# Patient Record
Sex: Male | Born: 1970 | Race: White | Hispanic: No
Health system: Midwestern US, Community
[De-identification: ages and names within clinical notes are randomized; demographics above are authoritative.]

## PROBLEM LIST (undated history)

## (undated) DIAGNOSIS — N200 Calculus of kidney: Secondary | ICD-10-CM

## (undated) HISTORY — PX: FACIAL COSMETIC SURGERY: SHX629

---

## 2005-04-12 ENCOUNTER — Emergency Department (HOSPITAL_COMMUNITY): Admission: EM | Admit: 2005-04-12 | Discharge: 2005-04-12 | Payer: Self-pay | Admitting: Emergency Medicine

## 2008-04-04 ENCOUNTER — Emergency Department: Payer: Self-pay | Admitting: Internal Medicine

## 2009-07-23 ENCOUNTER — Emergency Department: Payer: Self-pay | Admitting: Emergency Medicine

## 2009-07-26 ENCOUNTER — Observation Stay: Payer: Self-pay | Admitting: Urology

## 2009-09-13 ENCOUNTER — Inpatient Hospital Stay: Payer: Self-pay | Admitting: Psychiatry

## 2010-06-07 ENCOUNTER — Emergency Department: Payer: Self-pay | Admitting: Emergency Medicine

## 2013-01-18 ENCOUNTER — Emergency Department: Payer: Self-pay | Admitting: Emergency Medicine

## 2013-01-18 LAB — URINALYSIS, COMPLETE
Bacteria: NONE SEEN
Bilirubin,UR: NEGATIVE
Blood: NEGATIVE
Leukocyte Esterase: NEGATIVE
Ph: 6 (ref 4.5–8.0)
Protein: NEGATIVE
RBC,UR: <1 /HPF (ref 0–5)
Specific Gravity: 1.021 (ref 1.003–1.030)
Squamous Epithelial: NONE SEEN
WBC UR: NONE SEEN /HPF (ref 0–5)

## 2013-01-18 LAB — CBC
HCT: 46.1 % (ref 40.0–52.0)
MCH: 30.8 pg (ref 26.0–34.0)
MCHC: 34.9 g/dL (ref 32.0–36.0)
MCV: 88 fL (ref 80–100)
Platelet: 210 10*3/uL (ref 150–440)
RBC: 5.23 10*6/uL (ref 4.40–5.90)
RDW: 12.8 % (ref 11.5–14.5)
WBC: 6.8 10*3/uL (ref 3.8–10.6)

## 2013-01-18 LAB — COMPREHENSIVE METABOLIC PANEL
Alkaline Phosphatase: 96 U/L (ref 50–136)
BUN: 15 mg/dL (ref 7–18)
Calcium, Total: 9 mg/dL (ref 8.5–10.1)
Chloride: 107 mmol/L (ref 98–107)
Co2: 24 mmol/L (ref 21–32)
Creatinine: 1.1 mg/dL (ref 0.60–1.30)
EGFR (African American): >60
Glucose: 94 mg/dL (ref 65–99)
Osmolality: 274 (ref 275–301)
Potassium: 3.9 mmol/L (ref 3.5–5.1)
SGPT (ALT): 29 U/L (ref 12–78)

## 2013-04-18 ENCOUNTER — Emergency Department: Payer: Self-pay | Admitting: Emergency Medicine

## 2013-04-18 LAB — PROTIME-INR: Prothrombin Time: 12.9 secs (ref 11.5–14.7)

## 2013-04-18 LAB — CBC
HCT: 40.8 % (ref 40.0–52.0)
MCH: 31.3 pg (ref 26.0–34.0)
MCHC: 36.3 g/dL — ABNORMAL HIGH (ref 32.0–36.0)
MCV: 86 fL (ref 80–100)
WBC: 5.8 10*3/uL (ref 3.8–10.6)

## 2013-04-18 LAB — COMPREHENSIVE METABOLIC PANEL
Anion Gap: 7 (ref 7–16)
BUN: 13 mg/dL (ref 7–18)
Calcium, Total: 9.2 mg/dL (ref 8.5–10.1)
Co2: 25 mmol/L (ref 21–32)
Creatinine: 1.05 mg/dL (ref 0.60–1.30)
EGFR (African American): >60
Osmolality: 278 (ref 275–301)
Potassium: 3.7 mmol/L (ref 3.5–5.1)
SGPT (ALT): 28 U/L (ref 12–78)

## 2013-04-18 LAB — APTT: Activated PTT: 27.5 secs (ref 23.6–35.9)

## 2013-07-31 ENCOUNTER — Emergency Department: Payer: Self-pay | Admitting: Emergency Medicine

## 2013-07-31 LAB — BASIC METABOLIC PANEL
Calcium, Total: 9.1 mg/dL (ref 8.5–10.1)
Chloride: 106 mmol/L (ref 98–107)
EGFR (Non-African Amer.): >60
Glucose: 138 mg/dL — ABNORMAL HIGH (ref 65–99)
Osmolality: 278 (ref 275–301)
Sodium: 138 mmol/L (ref 136–145)

## 2013-07-31 LAB — CBC
HCT: 42.8 % (ref 40.0–52.0)
HGB: 15.1 g/dL (ref 13.0–18.0)
MCV: 87 fL (ref 80–100)
RBC: 4.94 10*6/uL (ref 4.40–5.90)
RDW: 12.7 % (ref 11.5–14.5)

## 2013-08-01 LAB — URINALYSIS, COMPLETE
Blood: NEGATIVE
Glucose,UR: NEGATIVE mg/dL (ref 0–75)
Nitrite: NEGATIVE
RBC,UR: <1 /HPF (ref 0–5)
Specific Gravity: 1.026 (ref 1.003–1.030)
Squamous Epithelial: 1
WBC UR: 1 /HPF (ref 0–5)

## 2013-12-21 ENCOUNTER — Emergency Department: Payer: Self-pay | Admitting: Emergency Medicine

## 2014-06-11 ENCOUNTER — Emergency Department: Payer: Self-pay | Admitting: Emergency Medicine

## 2014-06-11 LAB — URINALYSIS, COMPLETE
Bilirubin,UR: NEGATIVE
Blood: NEGATIVE
GLUCOSE, UR: NEGATIVE mg/dL (ref 0–75)
KETONE: NEGATIVE
LEUKOCYTE ESTERASE: NEGATIVE
Nitrite: NEGATIVE
PH: 6 (ref 4.5–8.0)
PROTEIN: NEGATIVE
Specific Gravity: 1.02 (ref 1.003–1.030)
Squamous Epithelial: NONE SEEN
WBC UR: 1 /HPF (ref 0–5)

## 2014-06-11 LAB — CBC WITH DIFFERENTIAL/PLATELET
Basophil #: 0.1 10*3/uL (ref 0.0–0.1)
Basophil %: 0.7 %
Eosinophil #: 0.5 10*3/uL (ref 0.0–0.7)
Eosinophil %: 6.6 %
HCT: 44.6 % (ref 40.0–52.0)
HGB: 15.5 g/dL (ref 13.0–18.0)
Lymphocyte #: 2.5 10*3/uL (ref 1.0–3.6)
Lymphocyte %: 34.6 %
MCH: 31.1 pg (ref 26.0–34.0)
MCHC: 34.8 g/dL (ref 32.0–36.0)
MCV: 89 fL (ref 80–100)
Monocyte #: 0.5 x10 3/mm (ref 0.2–1.0)
Monocyte %: 7 %
Neutrophil #: 3.7 10*3/uL (ref 1.4–6.5)
Neutrophil %: 51.1 %
Platelet: 200 10*3/uL (ref 150–440)
RBC: 4.99 10*6/uL (ref 4.40–5.90)
RDW: 12.7 % (ref 11.5–14.5)
WBC: 7.2 10*3/uL (ref 3.8–10.6)

## 2014-06-11 LAB — COMPREHENSIVE METABOLIC PANEL
ALBUMIN: 3.6 g/dL (ref 3.4–5.0)
ALK PHOS: 79 U/L
ALT: 25 U/L
Anion Gap: 7 (ref 7–16)
BILIRUBIN TOTAL: 0.3 mg/dL (ref 0.2–1.0)
BUN: 13 mg/dL (ref 7–18)
CALCIUM: 9 mg/dL (ref 8.5–10.1)
CO2: 25 mmol/L (ref 21–32)
Chloride: 109 mmol/L — ABNORMAL HIGH (ref 98–107)
Creatinine: 1.08 mg/dL (ref 0.60–1.30)
EGFR (African American): >60
GLUCOSE: 105 mg/dL — AB (ref 65–99)
Osmolality: 282 (ref 275–301)
Potassium: 3.9 mmol/L (ref 3.5–5.1)
SGOT(AST): 26 U/L (ref 15–37)
SODIUM: 141 mmol/L (ref 136–145)
TOTAL PROTEIN: 6.9 g/dL (ref 6.4–8.2)

## 2016-07-22 ENCOUNTER — Encounter: Payer: Self-pay | Admitting: Emergency Medicine

## 2016-07-22 ENCOUNTER — Emergency Department
Admission: EM | Admit: 2016-07-22 | Discharge: 2016-07-22 | Disposition: A | Discharge status: Home / Self Care | Payer: Self-pay | Attending: Emergency Medicine | Admitting: Emergency Medicine

## 2016-07-22 ENCOUNTER — Emergency Department: Payer: Self-pay

## 2016-07-22 DIAGNOSIS — R1031 Right lower quadrant pain: Secondary | ICD-10-CM | POA: Insufficient documentation

## 2016-07-22 DIAGNOSIS — F17200 Nicotine dependence, unspecified, uncomplicated: Secondary | ICD-10-CM | POA: Insufficient documentation

## 2016-07-22 DIAGNOSIS — R1032 Left lower quadrant pain: Secondary | ICD-10-CM | POA: Insufficient documentation

## 2016-07-22 DIAGNOSIS — R110 Nausea: Secondary | ICD-10-CM | POA: Insufficient documentation

## 2016-07-22 DIAGNOSIS — R109 Unspecified abdominal pain: Secondary | ICD-10-CM

## 2016-07-22 HISTORY — DX: Calculus of kidney: N20.0

## 2016-07-22 LAB — COMPREHENSIVE METABOLIC PANEL
ALBUMIN: 4.5 g/dL (ref 3.5–5.0)
ALK PHOS: 60 U/L (ref 38–126)
ALT: 16 U/L — ABNORMAL LOW (ref 17–63)
ANION GAP: 7 (ref 5–15)
AST: 18 U/L (ref 15–41)
BUN: 12 mg/dL (ref 6–20)
CHLORIDE: 106 mmol/L (ref 101–111)
CO2: 29 mmol/L (ref 22–32)
CREATININE: 1 mg/dL (ref 0.61–1.24)
Calcium: 9.3 mg/dL (ref 8.9–10.3)
Glucose, Bld: 145 mg/dL — ABNORMAL HIGH (ref 65–99)
POTASSIUM: 4.3 mmol/L (ref 3.5–5.1)
Sodium: 142 mmol/L (ref 135–145)
Total Bilirubin: 0.6 mg/dL (ref 0.3–1.2)
Total Protein: 7.4 g/dL (ref 6.5–8.1)

## 2016-07-22 LAB — URINALYSIS COMPLETE WITH MICROSCOPIC (ARMC ONLY)
Bilirubin Urine: NEGATIVE
Glucose, UA: NEGATIVE mg/dL
Hgb urine dipstick: NEGATIVE
Ketones, ur: NEGATIVE mg/dL
Leukocytes, UA: NEGATIVE
Nitrite: NEGATIVE
Protein, ur: NEGATIVE mg/dL
Specific Gravity, Urine: 1.015 (ref 1.005–1.030)
pH: 5 (ref 5.0–8.0)

## 2016-07-22 LAB — CBC
HCT: 44.8 % (ref 40.0–52.0)
HEMOGLOBIN: 15.9 g/dL (ref 13.0–18.0)
MCH: 31.5 pg (ref 26.0–34.0)
MCHC: 35.5 g/dL (ref 32.0–36.0)
MCV: 88.7 fL (ref 80.0–100.0)
PLATELETS: 215 10*3/uL (ref 150–440)
RBC: 5.05 MIL/uL (ref 4.40–5.90)
RDW: 12.8 % (ref 11.5–14.5)
WBC: 5.7 10*3/uL (ref 3.8–10.6)

## 2016-07-22 LAB — LIPASE, BLOOD: LIPASE: 20 U/L (ref 11–51)

## 2016-07-22 MED ORDER — HYDROCODONE-ACETAMINOPHEN 5-325 MG PO TABS: 1.0000 | ORAL_TABLET | ORAL | 0 refills | Status: DC | PRN

## 2016-07-22 MED ORDER — ONDANSETRON HCL 4 MG PO TABS: 4.0000 mg | ORAL_TABLET | Freq: Every day | ORAL | 1 refills | Status: DC | PRN

## 2016-07-22 MED ORDER — MORPHINE SULFATE (PF) 4 MG/ML IV SOLN
INTRAVENOUS | Status: AC
  Filled 2016-07-22: qty 1

## 2016-07-22 MED ORDER — TAMSULOSIN HCL 0.4 MG PO CAPS: 0.4000 mg | ORAL_CAPSULE | Freq: Every day | ORAL | 0 refills | Status: DC

## 2016-07-22 MED ORDER — IOPAMIDOL (ISOVUE-300) INJECTION 61%
100.0000 mL | Freq: Once | INTRAVENOUS | Status: AC | PRN
  Administered 2016-07-22: 100 mL via INTRAVENOUS

## 2016-07-22 MED ORDER — MORPHINE SULFATE (PF) 4 MG/ML IV SOLN
4.0000 mg | Freq: Once | INTRAVENOUS | Status: AC
  Administered 2016-07-22: 4 mg via INTRAVENOUS

## 2016-07-22 MED ORDER — ONDANSETRON HCL 4 MG/2ML IJ SOLN
4.0000 mg | Freq: Once | INTRAMUSCULAR | Status: AC
  Administered 2016-07-22: 4 mg via INTRAVENOUS

## 2016-07-22 MED ORDER — KETOROLAC TROMETHAMINE 30 MG/ML IJ SOLN
30.0000 mg | Freq: Once | INTRAMUSCULAR | Status: AC
  Administered 2016-07-22: 30 mg via INTRAVENOUS
  Filled 2016-07-22: qty 1

## 2016-07-22 MED ORDER — NAPROXEN 500 MG PO TABS: 500.0000 mg | ORAL_TABLET | Freq: Two times a day (BID) | ORAL | 2 refills | Status: DC

## 2016-07-22 MED ORDER — ONDANSETRON HCL 4 MG/2ML IJ SOLN
INTRAMUSCULAR | Status: AC
  Filled 2016-07-22: qty 2

## 2016-07-22 MED ORDER — IOPAMIDOL (ISOVUE-300) INJECTION 61%
30.0000 mL | Freq: Once | INTRAVENOUS | Status: AC | PRN
  Administered 2016-07-22: 30 mL via ORAL

## 2016-07-22 NOTE — ED Notes (Signed)
Patient transported to CT 

## 2016-07-22 NOTE — ED Provider Notes (Signed)
[facility] Emergency Department Provider Note   ____________________________________________    I have reviewed the triage vital signs and the nursing notes.   HISTORY  Chief Complaint Flank Pain     HPI Arthur Kent is a 45 y.o. male who presents with complaints of abdominal pain. He reports right lower quadrant and left lower quadrant pain which is moderate to severe and began gradually this morning. He does report a long history of kidney stones but this feels different because of the gradual onset. He reports mild nausea but no vomiting. No fevers or chills. Normal stools.   Past Medical History:  Diagnosis Date  . Kidney stone     There are no active problems to display for this patient.   Past Surgical History:  Procedure Laterality Date  . FACIAL COSMETIC SURGERY      Prior to Admission medications   Medication Sig Start Date End Date Taking? Authorizing Provider  HYDROcodone-acetaminophen (NORCO/VICODIN) 5-325 MG tablet Take 1 tablet by mouth every 4 (four) hours as needed for moderate pain. 07/22/16   Jene Everyobert Penny Frisbie, MD  naproxen (NAPROSYN) 500 MG tablet Take 1 tablet (500 mg total) by mouth 2 (two) times daily with a meal. 07/22/16   Jene Everyobert Wallis Spizzirri, MD  ondansetron (ZOFRAN) 4 MG tablet Take 1 tablet (4 mg total) by mouth daily as needed for nausea or vomiting. 07/22/16   Jene Everyobert Kahlil Cowans, MD  tamsulosin (FLOMAX) 0.4 MG CAPS capsule Take 1 capsule (0.4 mg total) by mouth daily. 07/22/16   Jene Everyobert Roque Schill, MD     Allergies Codeine; Oxycodone; and Percocet [oxycodone-acetaminophen]  No family history on file.  Social History Social History  Substance Use Topics  . Smoking status: Current Every Day Smoker  . Smokeless tobacco: Never Used  . Alcohol use Yes    Review of Systems  Constitutional: No fever/chills  Cardiovascular: Denies chest pain. Respiratory: Denies Cough Gastrointestinal: As above  Genitourinary:  Negative for dysuria. No hematuria Musculoskeletal: Negative for back pain. Skin: Negative for rash.   10-point ROS otherwise negative.  ____________________________________________   PHYSICAL EXAM:  VITAL SIGNS: ED Triage Vitals  Enc Vitals Group     BP 07/22/16 0953 (!) 104/59     Pulse Rate 07/22/16 0953 87     Resp 07/22/16 0953 16     Temp 07/22/16 0956 98.6 F (37 C)     Temp Source 07/22/16 0953 Oral     SpO2 07/22/16 0953 94 %     Weight 07/22/16 0954 165 lb (74.8 kg)     Height 07/22/16 0954 5\' 11"  (1.803 m)     Head Circumference --      Peak Flow --      Pain Score 07/22/16 0956 10     Pain Loc --      Pain Edu? --      Excl. in GC? --     Constitutional: Alert and oriented. No acute distress. Pleasant and interactive Eyes: Conjunctivae are normal.   Nose: No congestion/rhinnorhea. Mouth/Throat: Mucous membranes are moist.    Cardiovascular: Normal rate, regular rhythm.  Good peripheral circulation. Respiratory: Normal respiratory effort.  No retractions. Lungs CTAB. Gastrointestinal: Mild to moderate tender to palpation the left lower quadrant and right lower quadrant. No distention.  No CVA tenderness. Genitourinary: deferred Musculoskeletal: No lower extremity tenderness nor edema.  Warm and well perfused Neurologic:  Normal speech and language. No gross focal neurologic deficits are appreciated.  Skin:  Skin is  warm, dry and intact. No rash noted. Psychiatric: Mood and affect are normal. Speech and behavior are normal.  ____________________________________________   LABS (all labs ordered are listed, but only abnormal results are displayed)  Labs Reviewed  COMPREHENSIVE METABOLIC PANEL - Abnormal; Notable for the following:       Result Value   Glucose, Bld 145 (*)    ALT 16 (*)    All other components within normal limits  URINALYSIS COMPLETEWITH MICROSCOPIC ([facility] ONLY) - Abnormal; Notable for the following:    Color, Urine YELLOW (*)     APPearance CLEAR (*)    Bacteria, UA RARE (*)    Squamous Epithelial / LPF 0-5 (*)    All other components within normal limits  LIPASE, BLOOD  CBC   ____________________________________________  EKG  None ____________________________________________  RADIOLOGY  CT abdomen and pelvis is normal ____________________________________________   PROCEDURES  Procedure(s) performed: No    Critical Care performed: No ____________________________________________   INITIAL IMPRESSION / ASSESSMENT AND PLAN / ED COURSE  Pertinent labs & imaging results that were available during my care of the patient were reviewed by me and considered in my medical decision making (see chart for details).  Patient presents with left lower quadrant and right lower quadrant abdominal pain. Differential includes diverticulitis, appendicitis, ureterolithiasis, nonspecific abdominal pain. Lab work thus far is unremarkable. We will give IV morphine, IV Zofran and obtain CT abdomen pelvis  Clinical Course  Value Comment By Time  Glucose: (!) 145 (Reviewed) Jene Every, MD 10/10 1156   ____________________________________________ ----------------------------------------- 3:15 PM on 07/22/2016 -----------------------------------------  Patient has had improvement in his pain although he continues to have some flank pain now on the right. We discussed admission for pain control but he declined, would prefer to go home on by mouth medications. We did discuss return precautions as well. Patient believes that he is not allergic to Vicodin so we will trial this.  FINAL CLINICAL IMPRESSION(S) / ED DIAGNOSES  Final diagnoses:  Flank pain      NEW MEDICATIONS STARTED DURING THIS VISIT:  New Prescriptions   HYDROCODONE-ACETAMINOPHEN (NORCO/VICODIN) 5-325 MG TABLET    Take 1 tablet by mouth every 4 (four) hours as needed for moderate pain.   NAPROXEN (NAPROSYN) 500 MG TABLET    Take 1 tablet (500 mg  total) by mouth 2 (two) times daily with a meal.   ONDANSETRON (ZOFRAN) 4 MG TABLET    Take 1 tablet (4 mg total) by mouth daily as needed for nausea or vomiting.   TAMSULOSIN (FLOMAX) 0.4 MG CAPS CAPSULE    Take 1 capsule (0.4 mg total) by mouth daily.     Note:  This document was prepared using Dragon voice recognition software and may include unintentional dictation errors.    Jene Every, MD 07/22/16 [phone]

## 2016-07-22 NOTE — ED Triage Notes (Signed)
Patient reports that last night he starting having LLQ pain that is not radiated to RLQ. Patient has hx of kidney stones. Denies urinary symptoms. Patient reports nausea, denies vomiting.

## 2016-07-22 NOTE — ED Notes (Signed)
Formatting of this note might be different from the original.  Patient transported to CT  Electronically signed by Ludwig Lean, RN at 07/22/2016  1:07 PM EDT

## 2016-07-22 NOTE — ED Provider Notes (Signed)
Formatting of this note is different from the original.  Images from the original note were not included.      [facility]  Emergency Department Provider Note    ____________________________________________    I have reviewed the triage vital signs and the nursing notes.    HISTORY    Chief Complaint  Flank Pain    HPI  Alan Gomez is a 45 y.o. male who presents with complaints of abdominal pain. He reports right lower quadrant and left lower quadrant pain which is moderate to severe and began gradually this morning. He does report a long history of kidney stones but this feels different because of the gradual onset. He reports mild nausea but no vomiting. No fevers or chills. Normal stools.    Past Medical History:   Diagnosis Date   ? Kidney stone      There are no active problems to display for this patient.    Past Surgical History:   Procedure Laterality Date   ? FACIAL COSMETIC SURGERY       Prior to Admission medications    Medication Sig Start Date End Date Taking? Authorizing Provider   HYDROcodone-acetaminophen (NORCO/VICODIN) 5-325 MG tablet Take 1 tablet by mouth every 4 (four) hours as needed for moderate pain. 07/22/16   Jene Every, MD   naproxen (NAPROSYN) 500 MG tablet Take 1 tablet (500 mg total) by mouth 2 (two) times daily with a meal. 07/22/16   Jene Every, MD   ondansetron (ZOFRAN) 4 MG tablet Take 1 tablet (4 mg total) by mouth daily as needed for nausea or vomiting. 07/22/16   Jene Every, MD   tamsulosin (FLOMAX) 0.4 MG CAPS capsule Take 1 capsule (0.4 mg total) by mouth daily. 07/22/16   Jene Every, MD       Allergies  Codeine; Oxycodone; and Percocet [oxycodone-acetaminophen]    No family history on file.    Social History  Social History   Substance Use Topics   ? Smoking status: Current Every Day Smoker   ? Smokeless tobacco: Never Used   ? Alcohol use Yes     Review of Systems    Constitutional: No fever/chills    Cardiovascular: Denies chest  pain.  Respiratory: Denies Cough  Gastrointestinal: As above   Genitourinary: Negative for dysuria. No hematuria  Musculoskeletal: Negative for back pain.  Skin: Negative for rash.    10-point ROS otherwise negative.    ____________________________________________    PHYSICAL EXAM:    VITAL SIGNS:  ED Triage Vitals   Enc Vitals Group      BP 07/22/16 0953 (!) 104/59      Pulse Rate 07/22/16 0953 87      Resp 07/22/16 0953 16      Temp 07/22/16 0956 98.6 F (37 C)      Temp Source 07/22/16 0953 Oral      SpO2 07/22/16 0953 94 %      Weight 07/22/16 0954 165 lb (74.8 kg)      Height 07/22/16 0954 5\' 11"  (1.803 m)      Head Circumference --       Peak Flow --       Pain Score 07/22/16 0956 10      Pain Loc --       Pain Edu? --       Excl. in GC? --      Constitutional: Alert and oriented. No acute distress. Pleasant and interactive  Eyes: Conjunctivae  are normal.     Nose: No congestion/rhinnorhea.  Mouth/Throat: Mucous membranes are moist.      Cardiovascular: Normal rate, regular rhythm.  Good peripheral circulation.  Respiratory: Normal respiratory effort.  No retractions. Lungs CTAB.  Gastrointestinal: Mild to moderate tender to palpation the left lower quadrant and right lower quadrant. No distention.  No CVA tenderness.  Genitourinary: deferred  Musculoskeletal: No lower extremity tenderness nor edema.  Warm and well perfused  Neurologic:  Normal speech and language. No gross focal neurologic deficits are appreciated.   Skin:  Skin is warm, dry and intact. No rash noted.  Psychiatric: Mood and affect are normal. Speech and behavior are normal.    ____________________________________________    LABS  (all labs ordered are listed, but only abnormal results are displayed)    Labs Reviewed   COMPREHENSIVE METABOLIC PANEL - Abnormal; Notable for the following:        Result Value    Glucose, Bld 145 (*)     ALT 16 (*)     All other components within normal limits   URINALYSIS COMPLETEWITH MICROSCOPIC ([facility] ONLY) -  Abnormal; Notable for the following:     Color, Urine YELLOW (*)     APPearance CLEAR (*)     Bacteria, UA RARE (*)     Squamous Epithelial / LPF 0-5 (*)     All other components within normal limits   LIPASE, BLOOD   CBC     ____________________________________________    EKG    None  ____________________________________________    RADIOLOGY    CT abdomen and pelvis is normal  ____________________________________________    PROCEDURES    Procedure(s) performed: No    Critical Care performed: No  ____________________________________________    INITIAL IMPRESSION / ASSESSMENT AND PLAN / ED COURSE    Pertinent labs & imaging results that were available during my care of the patient were reviewed by me and considered in my medical decision making (see chart for details).    Patient presents with left lower quadrant and right lower quadrant abdominal pain. Differential includes diverticulitis, appendicitis, ureterolithiasis, nonspecific abdominal pain. Lab work thus far is unremarkable. We will give IV morphine, IV Zofran and obtain CT abdomen pelvis    Clinical Course   Value Comment By Time   Glucose: (!) 145 (Reviewed) Jene Every, MD 10/10 1156     ____________________________________________  -----------------------------------------  3:15 PM on 07/22/2016  -----------------------------------------    Patient has had improvement in his pain although he continues to have some flank pain now on the right. We discussed admission for pain control but he declined, would prefer to go home on by mouth medications. We did discuss return precautions as well. Patient believes that he is not allergic to Vicodin so we will trial this.    FINAL CLINICAL IMPRESSION(S) / ED DIAGNOSES    Final diagnoses:   Flank pain     NEW MEDICATIONS STARTED DURING THIS VISIT:    New Prescriptions    HYDROCODONE-ACETAMINOPHEN (NORCO/VICODIN) 5-325 MG TABLET    Take 1 tablet by mouth every 4 (four) hours as needed for moderate pain.     NAPROXEN (NAPROSYN) 500 MG TABLET    Take 1 tablet (500 mg total) by mouth 2 (two) times daily with a meal.    ONDANSETRON (ZOFRAN) 4 MG TABLET    Take 1 tablet (4 mg total) by mouth daily as needed for nausea or vomiting.    TAMSULOSIN (FLOMAX) 0.4 MG CAPS CAPSULE  Take 1 capsule (0.4 mg total) by mouth daily.     Note:  This document was prepared using Dragon voice recognition software and may include unintentional dictation errors.      Jene Every, MD  07/22/16 [phone]    Electronically signed by Jene Every, MD at 07/22/2016  3:16 PM EDT

## 2016-07-22 NOTE — ED Triage Notes (Signed)
Formatting of this note might be different from the original.  Patient reports that last night he starting having LLQ pain that is not radiated to RLQ. Patient has hx of kidney stones. Denies urinary symptoms. Patient reports nausea, denies vomiting.   Electronically signed by Eliot Ford, RN at 07/22/2016  9:56 AM EDT

## 2016-07-27 ENCOUNTER — Encounter: Payer: Self-pay | Admitting: Emergency Medicine

## 2016-07-27 DIAGNOSIS — Z791 Long term (current) use of non-steroidal anti-inflammatories (NSAID): Secondary | ICD-10-CM | POA: Insufficient documentation

## 2016-07-27 DIAGNOSIS — K5900 Constipation, unspecified: Secondary | ICD-10-CM | POA: Insufficient documentation

## 2016-07-27 DIAGNOSIS — F17200 Nicotine dependence, unspecified, uncomplicated: Secondary | ICD-10-CM | POA: Insufficient documentation

## 2016-07-27 LAB — COMPREHENSIVE METABOLIC PANEL
ALT: 24 U/L (ref 17–63)
AST: 26 U/L (ref 15–41)
Albumin: 4 g/dL (ref 3.5–5.0)
Alkaline Phosphatase: 65 U/L (ref 38–126)
Anion gap: 3 — ABNORMAL LOW (ref 5–15)
BILIRUBIN TOTAL: 0.5 mg/dL (ref 0.3–1.2)
BUN: 12 mg/dL (ref 6–20)
CHLORIDE: 107 mmol/L (ref 101–111)
CO2: 30 mmol/L (ref 22–32)
Calcium: 9.4 mg/dL (ref 8.9–10.3)
Creatinine, Ser: 1.03 mg/dL (ref 0.61–1.24)
Glucose, Bld: 117 mg/dL — ABNORMAL HIGH (ref 65–99)
POTASSIUM: 3.5 mmol/L (ref 3.5–5.1)
Sodium: 140 mmol/L (ref 135–145)
TOTAL PROTEIN: 6.7 g/dL (ref 6.5–8.1)

## 2016-07-27 LAB — URINALYSIS COMPLETE WITH MICROSCOPIC (ARMC ONLY)
BACTERIA UA: NONE SEEN
BILIRUBIN URINE: NEGATIVE
Glucose, UA: NEGATIVE mg/dL
HGB URINE DIPSTICK: NEGATIVE
KETONES UR: NEGATIVE mg/dL
LEUKOCYTES UA: NEGATIVE
NITRITE: NEGATIVE
PH: 6 (ref 5.0–8.0)
PROTEIN: NEGATIVE mg/dL
Specific Gravity, Urine: 1.016 (ref 1.005–1.030)
Squamous Epithelial / LPF: NONE SEEN

## 2016-07-27 LAB — CBC
HEMATOCRIT: 44 % (ref 40.0–52.0)
Hemoglobin: 15.3 g/dL (ref 13.0–18.0)
MCH: 31.3 pg (ref 26.0–34.0)
MCHC: 34.8 g/dL (ref 32.0–36.0)
MCV: 89.9 fL (ref 80.0–100.0)
PLATELETS: 183 10*3/uL (ref 150–440)
RBC: 4.89 MIL/uL (ref 4.40–5.90)
RDW: 12.9 % (ref 11.5–14.5)
WBC: 7.4 10*3/uL (ref 3.8–10.6)

## 2016-07-27 LAB — LIPASE, BLOOD: LIPASE: 19 U/L (ref 11–51)

## 2016-07-27 NOTE — ED Triage Notes (Signed)
Patient with complaint of mid lower abd pain. Patient was seen on Tuesday for the same. Patient reports that at that time the pain was in the right lower abd. Patient states that the md was treating him for a kidney stone but was unable to see a stone on ct scan. Patient states that his pain has been controlled with pain medication. 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

## 2016-07-28 ENCOUNTER — Emergency Department: Payer: Self-pay

## 2016-07-28 ENCOUNTER — Emergency Department
Admission: EM | Admit: 2016-07-28 | Discharge: 2016-07-28 | Disposition: A | Discharge status: Home / Self Care | Payer: Self-pay | Attending: Emergency Medicine | Admitting: Emergency Medicine

## 2016-07-28 DIAGNOSIS — R1030 Lower abdominal pain, unspecified: Secondary | ICD-10-CM

## 2016-07-28 DIAGNOSIS — K5900 Constipation, unspecified: Secondary | ICD-10-CM

## 2016-07-28 MED ORDER — KETOROLAC TROMETHAMINE 30 MG/ML IJ SOLN
INTRAMUSCULAR | Status: AC
  Administered 2016-07-28: 30 mg via INTRAVENOUS
  Filled 2016-07-28: qty 1

## 2016-07-28 MED ORDER — DICYCLOMINE HCL 20 MG PO TABS: 20.0000 mg | ORAL_TABLET | Freq: Four times a day (QID) | ORAL | 0 refills | Status: DC | PRN

## 2016-07-28 MED ORDER — MORPHINE SULFATE (PF) 2 MG/ML IV SOLN
INTRAVENOUS | Status: DC
Start: 2016-07-28 — End: 2016-07-28
  Filled 2016-07-28: qty 2

## 2016-07-28 MED ORDER — MORPHINE SULFATE (PF) 4 MG/ML IV SOLN
4.0000 mg | Freq: Once | INTRAVENOUS | Status: AC
Start: 2016-07-28 — End: 2016-07-28
  Administered 2016-07-28: 4 mg via INTRAVENOUS

## 2016-07-28 MED ORDER — HYDROCODONE-ACETAMINOPHEN 5-325 MG PO TABS: 1.0000 | ORAL_TABLET | Freq: Four times a day (QID) | ORAL | 0 refills | Status: DC | PRN

## 2016-07-28 MED ORDER — LACTULOSE 10 GM/15ML PO SOLN: 20.0000 g | Freq: Every day | ORAL | 0 refills | Status: DC | PRN

## 2016-07-28 MED ORDER — IBUPROFEN 800 MG PO TABS: 800.0000 mg | ORAL_TABLET | Freq: Three times a day (TID) | ORAL | 0 refills | Status: DC | PRN

## 2016-07-28 MED ORDER — SODIUM CHLORIDE 0.9 % IV BOLUS (SEPSIS)
1000.0000 mL | Freq: Once | INTRAVENOUS | Status: AC
  Administered 2016-07-28: 1000 mL via INTRAVENOUS

## 2016-07-28 MED ORDER — ONDANSETRON HCL 4 MG/2ML IJ SOLN
4.0000 mg | Freq: Once | INTRAMUSCULAR | Status: AC
  Administered 2016-07-28: 4 mg via INTRAVENOUS

## 2016-07-28 MED ORDER — ONDANSETRON HCL 4 MG/2ML IJ SOLN
INTRAMUSCULAR | Status: AC
  Administered 2016-07-28: 4 mg via INTRAVENOUS
  Filled 2016-07-28: qty 2

## 2016-07-28 MED ORDER — KETOROLAC TROMETHAMINE 30 MG/ML IJ SOLN
30.0000 mg | Freq: Once | INTRAMUSCULAR | Status: AC
  Administered 2016-07-28: 30 mg via INTRAVENOUS

## 2016-07-28 NOTE — ED Notes (Signed)
Warm blankets given. No other needs expressed.

## 2016-07-28 NOTE — ED Provider Notes (Signed)
[facility] Emergency Department Provider Note   ____________________________________________   First MD Initiated Contact with Patient 07/28/16 0111     (approximate)  I have reviewed the triage vital signs and the nursing notes.   HISTORY  Chief Complaint Abdominal Pain    HPI Arthur Kent is a 45 y.o. male who returns to the emergency department from home with a chief complain of abdominal pain. Patient was seen on 10/10 for left sided abdominal pain. Had a negative CT scan and discharged home on Vicodin. Patient tried to wean himself off Vicodin yesterday in preparation to go back to work but abdominal pain persists. Describes suprapubic and right lateral abdominal sharp pain which is not associated with fever, chills, nausea, vomiting, dysuria, diarrhea. Denies testicular pain or swelling. Last bowel movement 3 hours ago which is somewhat hard. Denies recent travel or trauma. Nothing makes his symptoms worse. Pain medication makes his symptoms better.History of kidney stone and states this feels similar.   Past Medical History:  Diagnosis Date  . Kidney stone     There are no active problems to display for this patient.   Past Surgical History:  Procedure Laterality Date  . FACIAL COSMETIC SURGERY      Prior to Admission medications   Medication Sig Start Date End Date Taking? Authorizing Provider  dicyclomine (BENTYL) 20 MG tablet Take 1 tablet (20 mg total) by mouth every 6 (six) hours as needed. 07/28/16   Irean Hong, MD  HYDROcodone-acetaminophen (NORCO) 5-325 MG tablet Take 1 tablet by mouth every 6 (six) hours as needed for moderate pain. 07/28/16   Irean Hong, MD  ibuprofen (ADVIL,MOTRIN) 800 MG tablet Take 1 tablet (800 mg total) by mouth every 8 (eight) hours as needed for moderate pain. 07/28/16   Irean Hong, MD  lactulose (CHRONULAC) 10 GM/15ML solution Take 30 mLs (20 g total) by mouth daily as needed for mild  constipation. 07/28/16   Irean Hong, MD  naproxen (NAPROSYN) 500 MG tablet Take 1 tablet (500 mg total) by mouth 2 (two) times daily with a meal. 07/22/16   Jene Every, MD  ondansetron (ZOFRAN) 4 MG tablet Take 1 tablet (4 mg total) by mouth daily as needed for nausea or vomiting. 07/22/16   Jene Every, MD  tamsulosin (FLOMAX) 0.4 MG CAPS capsule Take 1 capsule (0.4 mg total) by mouth daily. 07/22/16   Jene Every, MD    Allergies Codeine; Oxycodone; and Percocet [oxycodone-acetaminophen]  No family history on file.  Social History Social History  Substance Use Topics  . Smoking status: Current Every Day Smoker  . Smokeless tobacco: Never Used  . Alcohol use Yes     Comment: occasionally    Review of Systems  Constitutional: No fever/chills. Eyes: No visual changes. ENT: No sore throat. Cardiovascular: Denies chest pain. Respiratory: Denies shortness of breath. Gastrointestinal: Positive for abdominal pain.  No nausea, no vomiting.  No diarrhea.  Positive for constipation. Genitourinary: Negative for dysuria. Musculoskeletal: Negative for back pain. Skin: Negative for rash. Neurological: Negative for headaches, focal weakness or numbness.  10-point ROS otherwise negative.  ____________________________________________   PHYSICAL EXAM:  VITAL SIGNS: ED Triage Vitals [07/27/16 2258]  Enc Vitals Group     BP 116/74     Pulse Rate 62     Resp 18     Temp 98 F (36.7 C)     Temp Source Oral     SpO2 96 %  Weight 165 lb (74.8 kg)     Height 5\' 11"  (1.803 m)     Head Circumference      Peak Flow      Pain Score 8     Pain Loc      Pain Edu?      Excl. in GC?     Constitutional: Alert and oriented. Well appearing and in no acute distress. Eyes: Conjunctivae are normal. PERRL. EOMI. Head: Atraumatic. Nose: No congestion/rhinnorhea. Mouth/Throat: Mucous membranes are moist.  Oropharynx non-erythematous. Neck: No stridor.   Cardiovascular: Normal  rate, regular rhythm. Grossly normal heart sounds.  Good peripheral circulation. Respiratory: Normal respiratory effort.  No retractions. Lungs CTAB. Gastrointestinal: Soft and mildly tender to palpation in suprapubic area and right lateral abdomen without rebound or guarding. There is no pain at McBurney's point. No distention. No abdominal bruits. No CVA tenderness. Musculoskeletal: No lower extremity tenderness nor edema.  No joint effusions. Neurologic:  Normal speech and language. No gross focal neurologic deficits are appreciated. No gait instability. Skin:  Skin is warm, dry and intact. No rash noted. Psychiatric: Mood and affect are normal. Speech and behavior are normal.  ____________________________________________   LABS (all labs ordered are listed, but only abnormal results are displayed)  Labs Reviewed  COMPREHENSIVE METABOLIC PANEL - Abnormal; Notable for the following:       Result Value   Glucose, Bld 117 (*)    Anion gap 3 (*)    All other components within normal limits  URINALYSIS COMPLETEWITH MICROSCOPIC ([facility] ONLY) - Abnormal; Notable for the following:    Color, Urine YELLOW (*)    APPearance CLEAR (*)    All other components within normal limits  LIPASE, BLOOD  CBC   ____________________________________________  EKG  None ____________________________________________  RADIOLOGY  CT abdomen/pelvis interpreted per Dr. Gwenyth Benderadparvar: No acute intra-abdominal pelvic pathology. No hydronephrosis or  nephrolithiasis.    The diverticulosis.    Constipation. No bowel obstruction. Normal appendix.   ____________________________________________   PROCEDURES  Procedure(s) performed: None  Procedures  Critical Care performed: No  ____________________________________________   INITIAL IMPRESSION / ASSESSMENT AND PLAN / ED COURSE  Pertinent labs & imaging results that were available during my care of the patient were reviewed by me and considered  in my medical decision making (see chart for details).  45 year old male who returns to the emergency department for abdominal pain. Initially left lower quadrant, now suprapubic and right lateral abdominal pain. Discussed with patient and spouse; on the 10th he had CT with PO and IV contrast. I have low suspicion for appendicitis given the duration of symptoms, lack of fever and normal white count. Given his prior history of kidney stones, will obtain CT renal colic study.  Clinical Course  Comment By Time  Updated patient and spouse of CT imaging results. Will refer to surgery for further outpatient evaluation of bilateral inguinal hernias. Will add Bentyl and lactulose as needed for abdominal discomfort as well as constipation symptoms. Strict return precautions given. Both verbalize understanding and agree with plan of care. Irean HongJade J Sung, MD 10/16 [phone]  Much improved after Toradol. Will proceed with discharge as planned. Irean HongJade J Sung, MD 10/16 [phone]     ____________________________________________   FINAL CLINICAL IMPRESSION(S) / ED DIAGNOSES  Final diagnoses:  Lower abdominal pain  Constipation, unspecified constipation type      NEW MEDICATIONS STARTED DURING THIS VISIT:  Discharge Medication List as of 07/28/2016  3:32 AM    START taking  these medications   Details  dicyclomine (BENTYL) 20 MG tablet Take 1 tablet (20 mg total) by mouth every 6 (six) hours as needed., Starting Mon 07/28/2016, Print    ibuprofen (ADVIL,MOTRIN) 800 MG tablet Take 1 tablet (800 mg total) by mouth every 8 (eight) hours as needed for moderate pain., Starting Mon 07/28/2016, Print    lactulose (CHRONULAC) 10 GM/15ML solution Take 30 mLs (20 g total) by mouth daily as needed for mild constipation., Starting Mon 07/28/2016, Print         Note:  This document was prepared using Dragon voice recognition software and may include unintentional dictation errors.    Irean Hong, MD 07/28/16 [phone]

## 2016-07-28 NOTE — Discharge Instructions (Signed)
1. Take Bentyl during the day as needed for abdominal discomfort.  2. Take Norco at night for pain. 3. Start laxative daily as needed for bowel movements (Lactulose). 4. Return to the ER for worsening symptoms, persistent vomiting, difficulty breathing or other concerns.

## 2016-07-28 NOTE — ED Notes (Signed)
Patient transported to CT 

## 2016-10-16 ENCOUNTER — Other Ambulatory Visit: Payer: Self-pay

## 2016-10-16 DIAGNOSIS — R0789 Other chest pain: Secondary | ICD-10-CM | POA: Insufficient documentation

## 2016-10-16 DIAGNOSIS — R5381 Other malaise: Secondary | ICD-10-CM | POA: Insufficient documentation

## 2016-10-16 DIAGNOSIS — R42 Dizziness and giddiness: Secondary | ICD-10-CM | POA: Insufficient documentation

## 2016-10-16 DIAGNOSIS — R079 Chest pain, unspecified: Secondary | ICD-10-CM | POA: Diagnosis present

## 2016-10-16 DIAGNOSIS — F17200 Nicotine dependence, unspecified, uncomplicated: Secondary | ICD-10-CM | POA: Insufficient documentation

## 2016-10-16 NOTE — ED Triage Notes (Signed)
Pt in with co cp and dizziness x 45 min, no hx of the same. Has had recent cold symptoms, no cough. Pt co nausea at this time states has had 2 episodes of syncope since.

## 2016-10-17 ENCOUNTER — Emergency Department
Admission: EM | Admit: 2016-10-17 | Discharge: 2016-10-17 | Disposition: A | Discharge status: Home / Self Care | Payer: 59 | Attending: Emergency Medicine | Admitting: Emergency Medicine

## 2016-10-17 ENCOUNTER — Emergency Department: Payer: 59

## 2016-10-17 DIAGNOSIS — R5381 Other malaise: Secondary | ICD-10-CM

## 2016-10-17 DIAGNOSIS — R42 Dizziness and giddiness: Secondary | ICD-10-CM

## 2016-10-17 DIAGNOSIS — R0789 Other chest pain: Secondary | ICD-10-CM

## 2016-10-17 LAB — COMPREHENSIVE METABOLIC PANEL
ALBUMIN: 4.4 g/dL (ref 3.5–5.0)
ALK PHOS: 79 U/L (ref 38–126)
ALT: 21 U/L (ref 17–63)
ANION GAP: 9 (ref 5–15)
AST: 27 U/L (ref 15–41)
BILIRUBIN TOTAL: 0.7 mg/dL (ref 0.3–1.2)
BUN: 17 mg/dL (ref 6–20)
CALCIUM: 9.6 mg/dL (ref 8.9–10.3)
CO2: 23 mmol/L (ref 22–32)
Chloride: 105 mmol/L (ref 101–111)
Creatinine, Ser: 1 mg/dL (ref 0.61–1.24)
GFR calc Af Amer: >60 mL/min (ref >60)
GFR calc non Af Amer: >60 mL/min (ref >60)
GLUCOSE: 96 mg/dL (ref 65–99)
Potassium: 3.8 mmol/L (ref 3.5–5.1)
Sodium: 137 mmol/L (ref 135–145)
TOTAL PROTEIN: 7.3 g/dL (ref 6.5–8.1)

## 2016-10-17 LAB — TROPONIN I
Troponin I: <0.03 ng/mL (ref <0.03)
Troponin I: <0.03 ng/mL (ref <0.03)

## 2016-10-17 LAB — CBC
HEMATOCRIT: 45.1 % (ref 40.0–52.0)
HEMOGLOBIN: 16.5 g/dL (ref 13.0–18.0)
MCH: 31.7 pg (ref 26.0–34.0)
MCHC: 36.6 g/dL — ABNORMAL HIGH (ref 32.0–36.0)
MCV: 86.6 fL (ref 80.0–100.0)
Platelets: 236 10*3/uL (ref 150–440)
RBC: 5.21 MIL/uL (ref 4.40–5.90)
RDW: 12.7 % (ref 11.5–14.5)
WBC: 10.4 10*3/uL (ref 3.8–10.6)

## 2016-10-17 MED ORDER — MECLIZINE HCL 25 MG PO TABS: 25.0000 mg | ORAL_TABLET | Freq: Three times a day (TID) | ORAL | 0 refills | Status: DC | PRN

## 2016-10-17 MED ORDER — IOPAMIDOL (ISOVUE-370) INJECTION 76%
125.0000 mL | Freq: Once | INTRAVENOUS | Status: AC | PRN
  Administered 2016-10-17: 125 mL via INTRAVENOUS

## 2016-10-17 NOTE — ED Provider Notes (Signed)
[facility] Emergency Department Provider Note  ____________________________________________   None    (approximate)  I have reviewed the triage vital signs and the nursing notes.   HISTORY  Chief Complaint Chest Pain    HPI Arthur Kent is a 46 y.o. male with no significant reported past medical historywho presents for evaluation of a constellation of symptoms including acute onset chest pain, dizziness/vertigo, a couple of syncopal episodes, and numbness in his left arm.  He states that he was not feeling well today but with no specific symptoms.  He said on watch TV and then became acutely dizzy.  He went to the bathroom and had severe nausea and almost vomited and almost passed out.  He then had acute onset of sternal sharp chest pain that is nonradiating.  He denies shortness of breath.  He states that his pain now is moderate redness and severe as it was earlier but he still feels the chest discomfort.  His vertiginous is also improved though it is still also present.  Thing in particular makes his symptoms better nor worse.   Past Medical History:  Diagnosis Date  . Kidney stone     There are no active problems to display for this patient.   Past Surgical History:  Procedure Laterality Date  . FACIAL COSMETIC SURGERY      Prior to Admission medications   Medication Sig Start Date End Date Taking? Authorizing Provider  dicyclomine (BENTYL) 20 MG tablet Take 1 tablet (20 mg total) by mouth every 6 (six) hours as needed. 07/28/16   Irean Hong, MD  HYDROcodone-acetaminophen (NORCO) 5-325 MG tablet Take 1 tablet by mouth every 6 (six) hours as needed for moderate pain. 07/28/16   Irean Hong, MD  ibuprofen (ADVIL,MOTRIN) 800 MG tablet Take 1 tablet (800 mg total) by mouth every 8 (eight) hours as needed for moderate pain. 07/28/16   Irean Hong, MD  lactulose (CHRONULAC) 10 GM/15ML solution Take 30 mLs (20 g total) by mouth daily as needed  for mild constipation. 07/28/16   Irean Hong, MD  meclizine (ANTIVERT) 25 MG tablet Take 1 tablet (25 mg total) by mouth 3 (three) times daily as needed for dizziness. 10/17/16   Loleta Rose, MD  naproxen (NAPROSYN) 500 MG tablet Take 1 tablet (500 mg total) by mouth 2 (two) times daily with a meal. 07/22/16   Jene Every, MD  ondansetron (ZOFRAN) 4 MG tablet Take 1 tablet (4 mg total) by mouth daily as needed for nausea or vomiting. 07/22/16   Jene Every, MD  tamsulosin (FLOMAX) 0.4 MG CAPS capsule Take 1 capsule (0.4 mg total) by mouth daily. 07/22/16   Jene Every, MD    Allergies Codeine; Oxycodone; and Percocet [oxycodone-acetaminophen]  No family history on file.  Social History Social History  Substance Use Topics  . Smoking status: Current Every Day Smoker  . Smokeless tobacco: Never Used  . Alcohol use Yes     Comment: occasionally    Review of Systems Constitutional: No fever/chills.  General malaise. Eyes: No visual changes. ENT: No sore throat. Cardiovascular: +chest pain (mid-sternal) Respiratory: Denies shortness of breath. Gastrointestinal: No abdominal pain.  Nausea/vomiting x 1.  No diarrhea.  No constipation. Genitourinary: Negative for dysuria. Musculoskeletal: Negative for back pain. Skin: Negative for rash. Neurological: Numbness in his left forearm. No focal weakness.  No headache.  10-point ROS otherwise negative.  ____________________________________________   PHYSICAL EXAM:  VITAL SIGNS: ED Triage Vitals  Enc Vitals Group     BP 10/16/16 2350 123/87     Pulse Rate 10/16/16 2350 93     Resp 10/16/16 2350 20     Temp 10/16/16 2350 97.4 F (36.3 C)     Temp Source 10/16/16 2350 Oral     SpO2 10/16/16 2350 97 %     Weight 10/16/16 2349 170 lb (77.1 kg)     Height 10/16/16 2349 5\' 10"  (1.778 m)     Head Circumference --      Peak Flow --      Pain Score 10/16/16 2349 10     Pain Loc --      Pain Edu? --      Excl. in GC? --      Constitutional: Alert and oriented. Well appearing and in no acute distress. Eyes: Conjunctivae are normal. PERRL. EOMI. Head: Atraumatic. Nose: No congestion/rhinnorhea. Mouth/Throat: Mucous membranes are moist.  Oropharynx non-erythematous. Neck: No stridor.  No meningeal signs.   Cardiovascular: Normal rate, regular rhythm. Good peripheral circulation. Grossly normal heart sounds. Respiratory: Normal respiratory effort.  No retractions. Lungs CTAB. Gastrointestinal: Soft and nontender. No distention.  Musculoskeletal: No lower extremity tenderness nor edema. No gross deformities of extremities. Neurologic:  Normal speech and language. No gross focal neurologic deficits are appreciated.  Skin:  Skin is warm, dry and intact. No rash noted. Psychiatric: Mood and affect are normal. Speech and behavior are normal.  ____________________________________________   LABS (all labs ordered are listed, but only abnormal results are displayed)  Labs Reviewed  CBC - Abnormal; Notable for the following:       Result Value   MCHC 36.6 (*)    All other components within normal limits  COMPREHENSIVE METABOLIC PANEL  TROPONIN I  TROPONIN I   ____________________________________________  EKG  ED ECG REPORT I, Letta Cargile, the attending physician, personally viewed and interpreted this ECG.  Date: 10/16/2016 EKG Time: 23:50 Rate: 102 Rhythm: sinus tachycardia QRS Axis: normal Intervals: normal ST/T Wave abnormalities: normal Conduction Disturbances: none Narrative Interpretation: unremarkable  ____________________________________________  RADIOLOGY   Ct Head Wo Contrast  Result Date: 10/17/2016 CLINICAL DATA:  Chest pain and dizziness for 45 minutes. Vertigo and syncope. EXAM: CT HEAD WITHOUT CONTRAST TECHNIQUE: Contiguous axial images were obtained from the base of the skull through the vertex without intravenous contrast. COMPARISON:  CT HEAD April 12, 2005 FINDINGS: BRAIN:  The ventricles and sulci are normal. No intraparenchymal hemorrhage, mass effect nor midline shift. No acute large vascular territory infarcts. No abnormal extra-axial fluid collections. Basal cisterns are patent. VASCULAR: Unremarkable. SKULL/SOFT TISSUES: No skull fracture. No significant soft tissue swelling. ORBITS/SINUSES: The included ocular globes and orbital contents are normal.Mild paranasal sinus mucosal thickening. Mastoid air cells are well aerated. Soft tissue within the RIGHT greater than LEFT external auditory canals compatible with cerumen. OTHER: None. IMPRESSION: Normal CT HEAD. Electronically Signed   By: Awilda Metro M.D.   On: 10/17/2016 05:24   Ct Angio Chest/abd/pel For Dissection W And/or W/wo  Result Date: 10/17/2016 CLINICAL DATA:  Chest pain and lightheadedness for 45 minutes. Syncopal episodes. EXAM: CT ANGIOGRAPHY CHEST, ABDOMEN AND PELVIS TECHNIQUE: Multidetector CT imaging through the chest, abdomen and pelvis was performed using the standard protocol during bolus administration of intravenous contrast. Multiplanar reconstructed images and MIPs were obtained and reviewed to evaluate the vascular anatomy. CONTRAST:  125 mL Isovue 370 intravenous COMPARISON:  07/28/2016 FINDINGS: CTA CHEST FINDINGS Cardiovascular: Preferential opacification of the thoracic aorta. No  evidence of thoracic aortic aneurysm or dissection. Normal heart size. No pericardial effusion. The pulmonary arteries are also well opacified and normal. Mediastinum/Nodes: No enlarged mediastinal, hilar, or axillary lymph nodes. Thyroid gland, trachea, and esophagus demonstrate no significant findings. Lungs/Pleura: Lungs are clear. No pleural effusion or pneumothorax. Musculoskeletal: No chest wall abnormality. No acute or significant osseous findings. Review of the MIP images confirms the above findings. CTA ABDOMEN AND PELVIS FINDINGS VASCULAR Aorta: Normal caliber aorta without aneurysm, dissection, vasculitis  or significant stenosis. Celiac: Patent without evidence of aneurysm, dissection, vasculitis or significant stenosis. SMA: Patent without evidence of aneurysm, dissection, vasculitis or significant stenosis. Renals: Both renal arteries are patent without evidence of aneurysm, dissection, vasculitis, fibromuscular dysplasia or significant stenosis. IMA: Patent without evidence of aneurysm, dissection, vasculitis or significant stenosis. Inflow: Patent without evidence of aneurysm, dissection, vasculitis or significant stenosis. Veins: No obvious venous abnormality within the limitations of this arterial phase study. Review of the MIP images confirms the above findings. NON-VASCULAR Hepatobiliary: No focal liver abnormality is seen. No gallstones, gallbladder wall thickening, or biliary dilatation. Pancreas: Unremarkable. No pancreatic ductal dilatation or surrounding inflammatory changes. Spleen: Normal in size without focal abnormality. Adrenals/Urinary Tract: Adrenal glands are unremarkable. Kidneys are normal, without renal calculi, focal lesion, or hydronephrosis. Bladder is unremarkable. Stomach/Bowel: Stomach is within normal limits. Appendix is normal. No evidence of bowel wall thickening, distention, or inflammatory changes. Lymphatic: No adenopathy in the abdomen or pelvis. Reproductive: Unremarkable Other: No acute findings. Musculoskeletal: No significant skeletal lesion. Review of the MIP images confirms the above findings. IMPRESSION: No significant abnormality. Electronically Signed   By: Ellery Plunk M.D.   On: 10/17/2016 05:31    ____________________________________________   PROCEDURES  Procedure(s) performed:   Procedures   Critical Care performed: No ____________________________________________   INITIAL IMPRESSION / ASSESSMENT AND PLAN / ED COURSE  Pertinent labs & imaging results that were available during my care of the patient were reviewed by me and considered in my  medical decision making (see chart for details).   Clinical Course as of Oct 17 710  Caleen Essex Oct 17, 2016  1610 The patient has stable vitals and reassuring labs/EKG.  However, his constellation of symptoms are concerning with acute onset chest pain, syncope, dizziness/vertigo, and left arm numbness.  Will rule out aortic dissection with CTA, and will also obtain non-contrast head CT before giving IV contrast.  Patient stable at this time.  [CF]  0703 Patient reports feeling better, just "uncomfortable".  Has been in the ED for almost 7.5 hours.  No more dizziness/vertigo.  Occasional chest discomfort, but two negative troponins and normal EKG.  I discussed the reassuring implication of normal vitals, labs, EKG, ct head, and CTA chest/abd/pelvis with improved symptoms.  He clarified that he has had some viral symptoms recently and wondered if that might be what is making him feel bad.  I suspect that is the case.  Neurologically intact, highly doubt CVA, no indication for MR brain.  I gave my usual and customary return precautions, and the patient will follow up with Dr. Dossie Arbour.  [CF]    Clinical Course User Index [CF] Loleta Rose, MD    ____________________________________________  FINAL CLINICAL IMPRESSION(S) / ED DIAGNOSES  Final diagnoses:  Dizziness  Malaise  Atypical chest pain     MEDICATIONS GIVEN DURING THIS VISIT:  Medications  iopamidol (ISOVUE-370) 76 % injection 125 mL (125 mLs Intravenous Contrast Given 10/17/16 0456)     NEW OUTPATIENT MEDICATIONS STARTED DURING  THIS VISIT:  New Prescriptions   MECLIZINE (ANTIVERT) 25 MG TABLET    Take 1 tablet (25 mg total) by mouth 3 (three) times daily as needed for dizziness.    Modified Medications   No medications on file    Discontinued Medications   No medications on file     Note:  This document was prepared using Dragon voice recognition software and may include unintentional dictation errors.    Loleta Roseory Darrin Apodaca,  MD 10/17/16 [phone]

## 2016-10-17 NOTE — ED Notes (Signed)
Patient transported to CT via stretcher.

## 2016-10-17 NOTE — Discharge Instructions (Signed)
As we discussed, your workup today was reassuring.  Though we do not know exactly what is causing your symptoms, it appears that you have no emergent medical condition at this time and that you are safe to go home and follow up as recommended in this paperwork.  Please tell your regular doctor that in the Emergency Department you had normal vital signs, EKG, lab work, two negative troponins, and negative CT scans of your head, chest, abdomen, and pelvis.  Please return immediately to the Emergency Department if you develop any new or worsening symptoms that concern you.

## 2017-11-20 ENCOUNTER — Emergency Department
Admission: EM | Admit: 2017-11-20 | Discharge: 2017-11-20 | Disposition: A | Discharge status: Home / Self Care | Payer: BLUE CROSS/BLUE SHIELD | Attending: Emergency Medicine | Admitting: Emergency Medicine

## 2017-11-20 ENCOUNTER — Encounter: Payer: Self-pay | Admitting: Emergency Medicine

## 2017-11-20 ENCOUNTER — Other Ambulatory Visit: Payer: Self-pay

## 2017-11-20 ENCOUNTER — Emergency Department: Payer: BLUE CROSS/BLUE SHIELD

## 2017-11-20 DIAGNOSIS — F17200 Nicotine dependence, unspecified, uncomplicated: Secondary | ICD-10-CM | POA: Insufficient documentation

## 2017-11-20 DIAGNOSIS — Z79899 Other long term (current) drug therapy: Secondary | ICD-10-CM | POA: Insufficient documentation

## 2017-11-20 DIAGNOSIS — N23 Unspecified renal colic: Secondary | ICD-10-CM | POA: Insufficient documentation

## 2017-11-20 DIAGNOSIS — R1031 Right lower quadrant pain: Secondary | ICD-10-CM | POA: Diagnosis present

## 2017-11-20 LAB — CBC WITH DIFFERENTIAL/PLATELET
BASOS ABS: 0.1 10*3/uL (ref 0–0.1)
Basophils Relative: 1 %
Eosinophils Absolute: 0.3 10*3/uL (ref 0–0.7)
Eosinophils Relative: 6 %
HEMATOCRIT: 45 % (ref 40.0–52.0)
Hemoglobin: 15.6 g/dL (ref 13.0–18.0)
LYMPHS ABS: 1.2 10*3/uL (ref 1.0–3.6)
LYMPHS PCT: 21 %
MCH: 30.9 pg (ref 26.0–34.0)
MCHC: 34.7 g/dL (ref 32.0–36.0)
MCV: 89 fL (ref 80.0–100.0)
Monocytes Absolute: 0.3 10*3/uL (ref 0.2–1.0)
Monocytes Relative: 6 %
NEUTROS ABS: 3.6 10*3/uL (ref 1.4–6.5)
Neutrophils Relative %: 66 %
Platelets: 212 10*3/uL (ref 150–440)
RBC: 5.06 MIL/uL (ref 4.40–5.90)
RDW: 12.6 % (ref 11.5–14.5)
WBC: 5.5 10*3/uL (ref 3.8–10.6)

## 2017-11-20 LAB — URINALYSIS, COMPLETE (UACMP) WITH MICROSCOPIC
Bacteria, UA: NONE SEEN
Bilirubin Urine: NEGATIVE
GLUCOSE, UA: NEGATIVE mg/dL
Hgb urine dipstick: NEGATIVE
Ketones, ur: NEGATIVE mg/dL
Leukocytes, UA: NEGATIVE
NITRITE: NEGATIVE
PROTEIN: NEGATIVE mg/dL
SPECIFIC GRAVITY, URINE: 1.023 (ref 1.005–1.030)
pH: 5 (ref 5.0–8.0)

## 2017-11-20 LAB — BASIC METABOLIC PANEL
ANION GAP: 7 (ref 5–15)
BUN: 19 mg/dL (ref 6–20)
CHLORIDE: 109 mmol/L (ref 101–111)
CO2: 24 mmol/L (ref 22–32)
Calcium: 9.2 mg/dL (ref 8.9–10.3)
Creatinine, Ser: 0.92 mg/dL (ref 0.61–1.24)
GFR calc Af Amer: >60 mL/min (ref >60)
GLUCOSE: 101 mg/dL — AB (ref 65–99)
POTASSIUM: 4.1 mmol/L (ref 3.5–5.1)
Sodium: 140 mmol/L (ref 135–145)

## 2017-11-20 MED ORDER — KETOROLAC TROMETHAMINE 30 MG/ML IJ SOLN
30.0000 mg | Freq: Once | INTRAMUSCULAR | Status: AC
  Administered 2017-11-20: 30 mg via INTRAVENOUS
  Filled 2017-11-20: qty 1

## 2017-11-20 MED ORDER — KETOROLAC TROMETHAMINE 10 MG PO TABS: 10.0000 mg | ORAL_TABLET | Freq: Three times a day (TID) | ORAL | 0 refills | Status: DC | PRN

## 2017-11-20 MED ORDER — HYDROCODONE-ACETAMINOPHEN 5-325 MG PO TABS: 1.0000 | ORAL_TABLET | Freq: Four times a day (QID) | ORAL | 0 refills | Status: DC | PRN

## 2017-11-20 MED ORDER — TAMSULOSIN HCL 0.4 MG PO CAPS: 0.4000 mg | ORAL_CAPSULE | Freq: Every day | ORAL | 0 refills | Status: DC

## 2017-11-20 MED ORDER — ONDANSETRON HCL 4 MG PO TABS: 4.0000 mg | ORAL_TABLET | Freq: Three times a day (TID) | ORAL | 0 refills | Status: DC | PRN

## 2017-11-20 MED ORDER — ONDANSETRON HCL 4 MG/2ML IJ SOLN
4.0000 mg | Freq: Once | INTRAMUSCULAR | Status: AC
  Administered 2017-11-20: 4 mg via INTRAVENOUS
  Filled 2017-11-20: qty 2

## 2017-11-20 MED ORDER — MORPHINE SULFATE (PF) 4 MG/ML IV SOLN
4.0000 mg | Freq: Once | INTRAVENOUS | Status: AC
  Administered 2017-11-20: 4 mg via INTRAVENOUS
  Filled 2017-11-20: qty 1

## 2017-11-20 MED ORDER — SODIUM CHLORIDE 0.9 % IV BOLUS (SEPSIS)
1000.0000 mL | Freq: Once | INTRAVENOUS | Status: AC
  Administered 2017-11-20: 1000 mL via INTRAVENOUS

## 2017-11-20 MED ORDER — SODIUM CHLORIDE 0.9 % IV BOLUS (SEPSIS): 1000.0000 mL | Freq: Once | INTRAVENOUS | Status: DC

## 2017-11-20 NOTE — Discharge Instructions (Addendum)
You are being treating for presumed kidney stone, and the rest of your exam and evaluation are overall reassuring in the emergency room today.  We discussed the possibility of early appendicitis, and discussed whether or not to obtain CT right now and chose to hold off.  I am recommending 12-24-hour recheck with physician, given that it Saturday this may need in the emergency department.  Please come back to the emergency room immediately for any worsening pain or any fever for CT scan as the next testing option.  Return to the emergency department immediately for any worsening condition including inability to urinate, nausea and vomiting cannot keep medications down, uncontrolled pain, fever, or any other symptoms concerning to you.

## 2017-11-20 NOTE — ED Notes (Signed)
Patient transported to Ultrasound 

## 2017-11-20 NOTE — ED Notes (Signed)
Pt denies any further concerns regarding visit, pt in NAD at time of departure, VS stable. PT ambulatory,. Pt ride is waiting in lobby

## 2017-11-20 NOTE — ED Provider Notes (Signed)
[facility] Emergency Department Provider Note ____________________________________________   I have reviewed the triage vital signs and the triage nursing note.  HISTORY  Chief Complaint Flank Pain   Historian Patient  HPI Waynetta SandyGabriel Gordon Cinelli is a 47 y.o. male with a history of kidney stones, last about a year ago, never needed lithotripsy, presents today with pain in the right flank down into the right lower quadrant since about 2 AM this morning.  Pain currently 8 or 8-1/2 out of 10, at times goes up to 10.  He is not really made urine since 2 AM.  This feels similar to him to prior kidney stone.  Mild nausea without vomiting.  He has an allergy to Percocet, becomes itchy.  He states he can take hydrocodone.  Symptoms moderate to severe, nothing makes it worse or better.   Past Medical History:  Diagnosis Date  . Kidney stone     There are no active problems to display for this patient.   Past Surgical History:  Procedure Laterality Date  . FACIAL COSMETIC SURGERY      Prior to Admission medications   Medication Sig Start Date End Date Taking? Authorizing Provider  dicyclomine (BENTYL) 20 MG tablet Take 1 tablet (20 mg total) by mouth every 6 (six) hours as needed. 07/28/16   Irean HongSung, Jade J, MD  HYDROcodone-acetaminophen (NORCO/VICODIN) 5-325 MG tablet Take 1-2 tablets by mouth every 6 (six) hours as needed for moderate pain. 11/20/17   Governor RooksLord, Aleida Crandell, MD  ibuprofen (ADVIL,MOTRIN) 800 MG tablet Take 1 tablet (800 mg total) by mouth every 8 (eight) hours as needed for moderate pain. 07/28/16   Irean HongSung, Jade J, MD  ketorolac (TORADOL) 10 MG tablet Take 1 tablet (10 mg total) by mouth every 8 (eight) hours as needed for moderate pain. 11/20/17   Governor RooksLord, Derrel Moore, MD  lactulose (CHRONULAC) 10 GM/15ML solution Take 30 mLs (20 g total) by mouth daily as needed for mild constipation. 07/28/16   Irean HongSung, Jade J, MD  meclizine (ANTIVERT) 25 MG tablet Take 1 tablet (25  mg total) by mouth 3 (three) times daily as needed for dizziness. 10/17/16   Loleta RoseForbach, Cory, MD  naproxen (NAPROSYN) 500 MG tablet Take 1 tablet (500 mg total) by mouth 2 (two) times daily with a meal. 07/22/16   [name], Robert, MD  ondansetron (ZOFRAN) 4 MG tablet Take 1 tablet (4 mg total) by mouth every 8 (eight) hours as needed for nausea or vomiting. 11/20/17   Governor RooksLord, Zhara Gieske, MD  tamsulosin (FLOMAX) 0.4 MG CAPS capsule Take 1 capsule (0.4 mg total) by mouth daily. 11/20/17   Governor RooksLord, Nickie Deren, MD    Allergies  Allergen Reactions  . Codeine   . Oxycodone Itching  . Percocet [Oxycodone-Acetaminophen] Itching    No family history on file.  Social History Social History   Tobacco Use  . Smoking status: Current Every Day Smoker  . Smokeless tobacco: Never Used  Substance Use Topics  . Alcohol use: Yes    Comment: occasionally  . Drug use: Not on file    Review of Systems  Constitutional: Negative for fever. Eyes: Negative for visual changes. ENT: Negative for sore throat. Cardiovascular: Negative for chest pain. Respiratory: Negative for shortness of breath. Gastrointestinal: Negative for diarrhea. Genitourinary: No hematuria.  Musculoskeletal: Negative for back pain. Skin: Negative for rash. Neurological: Negative for headache.  ____________________________________________   PHYSICAL EXAM:  VITAL SIGNS: ED Triage Vitals  Enc Vitals Group     BP 11/20/17 0751  115/80     Pulse Rate 11/20/17 0751 71     Resp 11/20/17 0751 20     Temp 11/20/17 0751 98.5 F (36.9 C)     Temp Source 11/20/17 0751 Oral     SpO2 11/20/17 0751 97 %     Weight 11/20/17 0750 170 lb (77.1 kg)     Height --      Head Circumference --      Peak Flow --      Pain Score 11/20/17 0750 10     Pain Loc --      Pain Edu? --      Excl. in GC? --      Constitutional: Alert and oriented. Well appearing and in no distress. HEENT   Head: Normocephalic and atraumatic.      Eyes: Conjunctivae are  normal. Pupils equal and round.       Ears:         Nose: No congestion/rhinnorhea.   Mouth/Throat: Mucous membranes are moist.   Neck: No stridor. Cardiovascular/Chest: Normal rate, regular rhythm.  No murmurs, rubs, or gallops. Respiratory: Normal respiratory effort without tachypnea nor retractions. Breath sounds are clear and equal bilaterally. No wheezes/rales/rhonchi. Gastrointestinal: Soft. No distention, no guarding, no rebound. Nontender on abdominal palpation in the right upper quadrant and right lower quadrant.    Genitourinary/rectal:Deferred Musculoskeletal: Nontender with normal range of motion in all extremities. No joint effusions.  No lower extremity tenderness.  No edema. Neurologic:  Normal speech and language. No gross or focal neurologic deficits are appreciated. Skin:  Skin is warm, dry and intact. No rash noted. Psychiatric: Mood and affect are normal. Speech and behavior are normal. Patient exhibits appropriate insight and judgment.   ____________________________________________  LABS (pertinent positives/negatives) I, Governor Rooks, MD the attending physician have reviewed the labs noted below.  Labs Reviewed  URINALYSIS, COMPLETE (UACMP) WITH MICROSCOPIC - Abnormal; Notable for the following components:      Result Value   Color, Urine YELLOW (*)    APPearance CLEAR (*)    Squamous Epithelial / LPF 0-5 (*)    All other components within normal limits  BASIC METABOLIC PANEL - Abnormal; Notable for the following components:   Glucose, Bld 101 (*)    All other components within normal limits  CBC WITH DIFFERENTIAL/PLATELET     ____________________________________________  RADIOLOGY All Xrays were viewed by me.  Imaging interpreted by Radiologist, and I, Governor Rooks, MD the attending physician have reviewed the radiologist interpretation noted below.  Renal ultrasound: radiologist report  FINDINGS: Right Kidney:  Length: 11.4 cm. Echogenicity  within normal limits. No mass or hydronephrosis visualized.  Left Kidney:  Length: 11.3 cm. Echogenicity within normal limits. No mass or hydronephrosis visualized.  Bladder:  Appears normal for degree of bladder distention.  IMPRESSION: Normal renal ultrasound. __________________________________________  PROCEDURES  Procedure(s) performed: None  Critical Care performed: None   ____________________________________________  ED COURSE / ASSESSMENT AND PLAN  Pertinent labs & imaging results that were available during my care of the patient were reviewed by me and considered in my medical decision making (see chart for details).   Patient looks like he is quite uncomfortable, states this is similar to prior kidney stone pain.  Will start treating presumptively for likely kidney stone/renal colic.  We discussed avoiding CT scan at this point time, I think appendicitis and other intra-abdominal pathology is low, and from the standpoint of kidney stone, will obtain renal ultrasound.  Laboratory studies are  reassuring.  No elevated white blood cell count.  No renal failure.  Urinalysis without significant hematuria and certainly no evidence for urinary tract infection.  Renal ultrasound is reassuring.  Reevaluation, patient feels much better he still states his pain is up to 5 at times, but he states he feels much better.  He states he had a lot of frequency of urination and not able to pee much.  I discussed with him that without significant findings to feel completely confident in ureterolithiasis, I was considering recommendation of CT scan to rule out early appendicitis, although his white blood cell count is normal.  Patient himself states that this feels just like a kidney stone and with urinary symptoms, he would rather hold off on CT.  I think this is reasonable.  We discussed 12-24-hour abdominal recheck, and certainly return if any worsening or if he gets a fever.  He is a  very reliable patient.  DIFFERENTIAL DIAGNOSIS: Differential diagnosis includes, but is not limited to, acute appendicitis, renal colic, testicular torsion, urinary tract infection/pyelonephritis, prostatitis,  epididymitis, diverticulitis, small bowel obstruction or ileus, colitis, abdominal aortic aneurysm, gastroenteritis, hernia, etc.  CONSULTATIONS:   None  Patient / Family / Caregiver informed of clinical course, medical decision-making process, and agree with plan.   I discussed return precautions, follow-up instructions, and discharge instructions with patient and/or family.  Discharge Instructions :You are being treating for presumed kidney stone, and the rest of your exam and evaluation are overall reassuring in the emergency room today.  We discussed the possibility of early appendicitis, and discussed whether or not to obtain CT right now and chose to hold off.  I am recommending 12-24-hour recheck with physician, given that it Saturday this may need in the emergency department.  Please come back to the emergency room immediately for any worsening pain or any fever for CT scan as the next testing option.  Return to the emergency department immediately for any worsening condition including inability to urinate, nausea and vomiting cannot keep medications down, uncontrolled pain, fever, or any other symptoms concerning to you.     ___________________________________________   FINAL CLINICAL IMPRESSION(S) / ED DIAGNOSES   Final diagnoses:  Ureteral colic      ___________________________________________        Note: This dictation was prepared with Dragon dictation. Any transcriptional errors that result from this process are unintentional    Governor Rooks, MD 11/20/17 1214

## 2017-11-20 NOTE — ED Notes (Signed)
MD Lord at bedside. 

## 2017-11-20 NOTE — ED Triage Notes (Signed)
Pt right side flank pain started around 2am. Pt with hx of same. Still able to pass urine.

## 2017-11-24 ENCOUNTER — Emergency Department
Admission: EM | Admit: 2017-11-24 | Discharge: 2017-11-24 | Disposition: A | Discharge status: Home / Self Care | Payer: BLUE CROSS/BLUE SHIELD | Attending: Emergency Medicine | Admitting: Emergency Medicine

## 2017-11-24 ENCOUNTER — Encounter: Payer: Self-pay | Admitting: Emergency Medicine

## 2017-11-24 ENCOUNTER — Emergency Department: Payer: BLUE CROSS/BLUE SHIELD

## 2017-11-24 DIAGNOSIS — Z885 Allergy status to narcotic agent status: Secondary | ICD-10-CM | POA: Diagnosis not present

## 2017-11-24 DIAGNOSIS — N200 Calculus of kidney: Secondary | ICD-10-CM | POA: Insufficient documentation

## 2017-11-24 DIAGNOSIS — F17200 Nicotine dependence, unspecified, uncomplicated: Secondary | ICD-10-CM | POA: Diagnosis not present

## 2017-11-24 DIAGNOSIS — R109 Unspecified abdominal pain: Secondary | ICD-10-CM

## 2017-11-24 DIAGNOSIS — I700 Atherosclerosis of aorta: Secondary | ICD-10-CM | POA: Diagnosis not present

## 2017-11-24 DIAGNOSIS — R1032 Left lower quadrant pain: Secondary | ICD-10-CM | POA: Insufficient documentation

## 2017-11-24 LAB — CBC
HEMATOCRIT: 49.7 % (ref 40.0–52.0)
HEMOGLOBIN: 17.1 g/dL (ref 13.0–18.0)
MCH: 31 pg (ref 26.0–34.0)
MCHC: 34.4 g/dL (ref 32.0–36.0)
MCV: 90.1 fL (ref 80.0–100.0)
Platelets: 232 10*3/uL (ref 150–440)
RBC: 5.52 MIL/uL (ref 4.40–5.90)
RDW: 13 % (ref 11.5–14.5)
WBC: 6.2 10*3/uL (ref 3.8–10.6)

## 2017-11-24 LAB — URINALYSIS, COMPLETE (UACMP) WITH MICROSCOPIC
Bilirubin Urine: NEGATIVE
GLUCOSE, UA: NEGATIVE mg/dL
Hgb urine dipstick: NEGATIVE
Ketones, ur: NEGATIVE mg/dL
LEUKOCYTES UA: NEGATIVE
Nitrite: NEGATIVE
PROTEIN: NEGATIVE mg/dL
SPECIFIC GRAVITY, URINE: 1.015 (ref 1.005–1.030)
pH: 7 (ref 5.0–8.0)

## 2017-11-24 LAB — BASIC METABOLIC PANEL
ANION GAP: 9 (ref 5–15)
BUN: 17 mg/dL (ref 6–20)
CHLORIDE: 102 mmol/L (ref 101–111)
CO2: 27 mmol/L (ref 22–32)
Calcium: 9.5 mg/dL (ref 8.9–10.3)
Creatinine, Ser: 1.06 mg/dL (ref 0.61–1.24)
GFR calc non Af Amer: >60 mL/min (ref >60)
GLUCOSE: 94 mg/dL (ref 65–99)
POTASSIUM: 3.7 mmol/L (ref 3.5–5.1)
Sodium: 138 mmol/L (ref 135–145)

## 2017-11-24 MED ORDER — HYDROMORPHONE HCL 1 MG/ML IJ SOLN
1.0000 mg | Freq: Once | INTRAMUSCULAR | Status: AC
  Administered 2017-11-24: 1 mg via INTRAMUSCULAR

## 2017-11-24 MED ORDER — IBUPROFEN 800 MG PO TABS
800.0000 mg | ORAL_TABLET | Freq: Once | ORAL | Status: AC
  Administered 2017-11-24: 800 mg via ORAL
  Filled 2017-11-24: qty 1

## 2017-11-24 MED ORDER — IBUPROFEN 800 MG PO TABS: 800.0000 mg | ORAL_TABLET | Freq: Three times a day (TID) | ORAL | 0 refills | Status: DC | PRN

## 2017-11-24 MED ORDER — HYDROMORPHONE HCL 1 MG/ML IJ SOLN
INTRAMUSCULAR | Status: AC
  Administered 2017-11-24: 1 mg via INTRAMUSCULAR
  Filled 2017-11-24: qty 1

## 2017-11-24 MED ORDER — DIAZEPAM 5 MG PO TABS: 5.0000 mg | ORAL_TABLET | Freq: Three times a day (TID) | ORAL | 0 refills | Status: DC | PRN

## 2017-11-24 NOTE — ED Notes (Addendum)
Pt recently diagnosed with kidney stones, ran out of pain meds at home, here for pain relief , denies anuria

## 2017-11-24 NOTE — ED Provider Notes (Signed)
[facility] Emergency Department Provider Note       Time seen: ----------------------------------------- 5:50 PM on 11/24/2017 -----------------------------------------   I have reviewed the triage vital signs and the nursing notes.  HISTORY   Chief Complaint Flank Pain    HPI Arthur Kent is a 47 y.o. male with a history of renal colic who presents to the ED for right flank pain.  Patient was seen last week for same and had a negative ultrasound.  Patient reports the pain worsened yesterday and returned this morning.  He has run out of his hydrocodone that he was taking for pain.  Pain is currently 10 out of 10 in the right flank.  He feels pain is sharper than previous.  Past Medical History:  Diagnosis Date  . Kidney stone     There are no active problems to display for this patient.   Past Surgical History:  Procedure Laterality Date  . FACIAL COSMETIC SURGERY      Allergies Codeine; Oxycodone; and Percocet [oxycodone-acetaminophen]  Social History Social History   Tobacco Use  . Smoking status: Current Every Day Smoker  . Smokeless tobacco: Never Used  Substance Use Topics  . Alcohol use: Yes    Comment: occasionally  . Drug use: Not on file    Review of Systems Constitutional: Negative for fever. Cardiovascular: Negative for chest pain. Respiratory: Negative for shortness of breath. Gastrointestinal: Positive for abdominal pain Genitourinary: Negative for dysuria. Musculoskeletal: Negative for back pain. Skin: Negative for rash. Neurological: Negative for headaches, focal weakness or numbness.  All systems negative/normal/unremarkable except as stated in the HPI  ____________________________________________   PHYSICAL EXAM:  VITAL SIGNS: ED Triage Vitals  Enc Vitals Group     BP 11/24/17 1722 102/70     Pulse Rate 11/24/17 1722 88     Resp 11/24/17 1722 20     Temp 11/24/17 1722 98 F (36.7 C)     Temp  src --      SpO2 11/24/17 1722 100 %     Weight 11/24/17 1725 170 lb (77.1 kg)     Height 11/24/17 1725 5\' 9"  (1.753 m)     Head Circumference --      Peak Flow --      Pain Score 11/24/17 1725 10     Pain Loc --      Pain Edu? --      Excl. in GC? --     Constitutional: Alert and oriented.  Mild distress Eyes: Conjunctivae are normal. Normal extraocular movements. ENT   Head: Normocephalic and atraumatic.   Nose: No congestion/rhinnorhea.   Mouth/Throat: Mucous membranes are moist.   Neck: No stridor. Cardiovascular: Normal rate, regular rhythm. No murmurs, rubs, or gallops. Respiratory: Normal respiratory effort without tachypnea nor retractions. Breath sounds are clear and equal bilaterally. No wheezes/rales/rhonchi. Gastrointestinal: Mild right flank tenderness, no rebound or guarding.  Normal bowel sounds. Musculoskeletal: Nontender with normal range of motion in extremities. No lower extremity tenderness nor edema. Neurologic:  Normal speech and language. No gross focal neurologic deficits are appreciated.  Skin:  Skin is warm, dry and intact. No rash noted. Psychiatric: Mood and affect are normal. Speech and behavior are normal.  ___________________________________________  ED COURSE:  As part of my medical decision making, I reviewed the following data within the electronic MEDICAL RECORD NUMBER History obtained from family if available, nursing notes, old chart and ekg, as well as notes from prior ED visits. Patient presented for  flank pain, we will assess with labs and imaging as indicated at this time.   Procedures ____________________________________________   LABS (pertinent positives/negatives)  Labs Reviewed  URINALYSIS, COMPLETE (UACMP) WITH MICROSCOPIC - Abnormal; Notable for the following components:      Result Value   Color, Urine YELLOW (*)    APPearance CLOUDY (*)    Bacteria, UA MANY (*)    Squamous Epithelial / LPF 0-5 (*)    All other  components within normal limits  CBC  BASIC METABOLIC PANEL    RADIOLOGY Images were viewed by me  CT renal protocol  IMPRESSION: 1. No obstructive uropathy or other acute abdominal or pelvic abnormality. 2. Nonobstructive 3 mm left lower pole nephrolithiasis. 3. Mild calcific aortic atherosclerosis (ICD10-I70.0). ____________________________________________  DIFFERENTIAL DIAGNOSIS   Renal colic, pyelonephritis, radicular pain, muscle strain, gas pain  FINAL ASSESSMENT AND PLAN  Flank pain   Plan: Patient had presented for flank pain. Patient's labs were unremarkable. Patient's imaging was also unremarkable.  No clear etiology for his flank pain.  He be discharged with NSAIDs and muscle relaxants.   Ulice Dash, MD   Note: This note was generated in part or whole with voice recognition software. Voice recognition is usually quite accurate but there are transcription errors that can and very often do occur. I apologize for any typographical errors that were not detected and corrected.     Emily Filbert, MD 11/24/17 [phone]

## 2017-11-24 NOTE — ED Triage Notes (Signed)
Presents with right flank pain  States he was seen last week for same  States pain returned this sm

## 2018-05-27 ENCOUNTER — Emergency Department
Admission: EM | Admit: 2018-05-27 | Discharge: 2018-05-27 | Disposition: A | Discharge status: Home / Self Care | Payer: Worker's Compensation | Attending: Emergency Medicine | Admitting: Emergency Medicine

## 2018-05-27 ENCOUNTER — Encounter: Payer: Self-pay | Admitting: Emergency Medicine

## 2018-05-27 ENCOUNTER — Other Ambulatory Visit: Payer: Self-pay

## 2018-05-27 DIAGNOSIS — Y929 Unspecified place or not applicable: Secondary | ICD-10-CM | POA: Insufficient documentation

## 2018-05-27 DIAGNOSIS — S51832A Puncture wound without foreign body of left forearm, initial encounter: Secondary | ICD-10-CM | POA: Insufficient documentation

## 2018-05-27 DIAGNOSIS — Y939 Activity, unspecified: Secondary | ICD-10-CM | POA: Insufficient documentation

## 2018-05-27 DIAGNOSIS — Y999 Unspecified external cause status: Secondary | ICD-10-CM | POA: Insufficient documentation

## 2018-05-27 DIAGNOSIS — F17210 Nicotine dependence, cigarettes, uncomplicated: Secondary | ICD-10-CM | POA: Insufficient documentation

## 2018-05-27 DIAGNOSIS — W5911XA Bitten by nonvenomous snake, initial encounter: Secondary | ICD-10-CM | POA: Insufficient documentation

## 2018-05-27 DIAGNOSIS — Z79899 Other long term (current) drug therapy: Secondary | ICD-10-CM | POA: Insufficient documentation

## 2018-05-27 LAB — COMPREHENSIVE METABOLIC PANEL
ALT: 22 U/L (ref 0–44)
ANION GAP: 6 (ref 5–15)
AST: 23 U/L (ref 15–41)
Albumin: 4.3 g/dL (ref 3.5–5.0)
Alkaline Phosphatase: 69 U/L (ref 38–126)
BILIRUBIN TOTAL: 1 mg/dL (ref 0.3–1.2)
BUN: 19 mg/dL (ref 6–20)
CO2: 25 mmol/L (ref 22–32)
Calcium: 9.4 mg/dL (ref 8.9–10.3)
Chloride: 107 mmol/L (ref 98–111)
Creatinine, Ser: 0.92 mg/dL (ref 0.61–1.24)
GFR calc Af Amer: >60 mL/min (ref >60)
Glucose, Bld: 97 mg/dL (ref 70–99)
POTASSIUM: 4.2 mmol/L (ref 3.5–5.1)
Sodium: 138 mmol/L (ref 135–145)
TOTAL PROTEIN: 6.9 g/dL (ref 6.5–8.1)

## 2018-05-27 LAB — CBC
HEMATOCRIT: 43.6 % (ref 40.0–52.0)
Hemoglobin: 15.4 g/dL (ref 13.0–18.0)
MCH: 32.2 pg (ref 26.0–34.0)
MCHC: 35.4 g/dL (ref 32.0–36.0)
MCV: 91 fL (ref 80.0–100.0)
Platelets: 212 10*3/uL (ref 150–440)
RBC: 4.79 MIL/uL (ref 4.40–5.90)
RDW: 12.9 % (ref 11.5–14.5)
WBC: 6.9 10*3/uL (ref 3.8–10.6)

## 2018-05-27 LAB — APTT: aPTT: 26 seconds (ref 24–36)

## 2018-05-27 LAB — FIBRINOGEN: FIBRINOGEN: 268 mg/dL (ref 210–475)

## 2018-05-27 LAB — PROTIME-INR
INR: 0.92
Prothrombin Time: 12.3 seconds (ref 11.4–15.2)

## 2018-05-27 NOTE — ED Provider Notes (Signed)
[facility] Emergency Department Provider Note   ____________________________________________    I have reviewed the triage vital signs and the nursing notes.   HISTORY  Chief Complaint Snake Bite     HPI Arthur Kent is a 47 y.o. male who presents after a snakebite.  Patient reports he was bitten by a copperhead which latched onto his left forearm briefly.  This occurred just prior to arrival.  Patient reports he has been bitten by a copperhead before.  He states overall he feels well and has no complaints.  He has not taken anything for this.   Past Medical History:  Diagnosis Date  . Kidney stone     There are no active problems to display for this patient.   Past Surgical History:  Procedure Laterality Date  . FACIAL COSMETIC SURGERY      Prior to Admission medications   Medication Sig Start Date End Date Taking? Authorizing Provider  diazepam (VALIUM) 5 MG tablet Take 1 tablet (5 mg total) by mouth every 8 (eight) hours as needed for muscle spasms. 11/24/17   Emily FilbertWilliams, Jonathan E, MD  dicyclomine (BENTYL) 20 MG tablet Take 1 tablet (20 mg total) by mouth every 6 (six) hours as needed. 07/28/16   Irean HongSung, Jade J, MD  HYDROcodone-acetaminophen (NORCO/VICODIN) 5-325 MG tablet Take 1-2 tablets by mouth every 6 (six) hours as needed for moderate pain. 11/20/17   Governor RooksLord, Rebecca, MD  ibuprofen (ADVIL,MOTRIN) 800 MG tablet Take 1 tablet (800 mg total) by mouth every 8 (eight) hours as needed for moderate pain. 07/28/16   Irean HongSung, Jade J, MD  ibuprofen (ADVIL,MOTRIN) 800 MG tablet Take 1 tablet (800 mg total) by mouth every 8 (eight) hours as needed. 11/24/17   Emily FilbertWilliams, Jonathan E, MD  ketorolac (TORADOL) 10 MG tablet Take 1 tablet (10 mg total) by mouth every 8 (eight) hours as needed for moderate pain. 11/20/17   Governor RooksLord, Rebecca, MD  lactulose (CHRONULAC) 10 GM/15ML solution Take 30 mLs (20 g total) by mouth daily as needed for mild constipation.  07/28/16   Irean HongSung, Jade J, MD  meclizine (ANTIVERT) 25 MG tablet Take 1 tablet (25 mg total) by mouth 3 (three) times daily as needed for dizziness. 10/17/16   Loleta RoseForbach, Cory, MD  naproxen (NAPROSYN) 500 MG tablet Take 1 tablet (500 mg total) by mouth 2 (two) times daily with a meal. 07/22/16   [name], Kaytlin Burklow, MD  ondansetron (ZOFRAN) 4 MG tablet Take 1 tablet (4 mg total) by mouth every 8 (eight) hours as needed for nausea or vomiting. 11/20/17   Governor RooksLord, Rebecca, MD  tamsulosin (FLOMAX) 0.4 MG CAPS capsule Take 1 capsule (0.4 mg total) by mouth daily. 11/20/17   Governor RooksLord, Rebecca, MD     Allergies Codeine; Oxycodone; and Percocet [oxycodone-acetaminophen]  History reviewed. No pertinent family history.  Social History Social History   Tobacco Use  . Smoking status: Current Every Day Smoker  . Smokeless tobacco: Never Used  Substance Use Topics  . Alcohol use: Yes    Comment: occasionally  . Drug use: Never    Review of Systems  Constitutional: No dizziness Eyes: No visual changes.  ENT: No throat swelling Cardiovascular: Denies chest pain. Respiratory: Denies shortness of breath. Gastrointestinal: No abdominal pain.  No nausea, no vomiting.   Genitourinary: Negative for dysuria. Musculoskeletal: Negative for back pain. Skin: Mild erythema surrounding bite Neurological: Negative for headaches    ____________________________________________   PHYSICAL EXAM:  VITAL SIGNS: ED Triage Vitals [  05/27/18 1205]  Enc Vitals Group     BP 120/86     Pulse Rate 82     Resp 16     Temp 97.7 F (36.5 C)     Temp Source Oral     SpO2 96 %     Weight 81.6 kg (180 lb)     Height 1.778 m (5\' 10" )     Head Circumference      Peak Flow      Pain Score 5     Pain Loc      Pain Edu?      Excl. in GC?     Constitutional: Alert and oriented. No acute distress. Pleasant and interactive    Nose: No congestion/rhinnorhea. Mouth/Throat: Mucous membranes are moist.    Cardiovascular: Normal  rate, regular rhythm.   Good peripheral circulation. Respiratory: Normal respiratory effort.  No retractions Gastrointestinal: Soft and nontender. No distention.    Musculoskeletal: No lower extremity tenderness nor edema.  Warm and well perfused Neurologic:  Normal speech and language. No gross focal neurologic deficits are appreciated.  Skin:  Skin is warm, dry and intact.  Left medial forearm, very small area of erythema surrounding 2 small pinpoint puncture wounds consistent with snakebite.  No streaking, compartments soft, full range of motion Psychiatric: Mood and affect are normal. Speech and behavior are normal.  ____________________________________________   LABS (all labs ordered are listed, but only abnormal results are displayed)  Labs Reviewed  CBC  COMPREHENSIVE METABOLIC PANEL  APTT  PROTIME-INR  FIBRINOGEN   ____________________________________________  EKG  None ____________________________________________  RADIOLOGY   ____________________________________________   PROCEDURES  Procedure(s) performed: No  Procedures   Critical Care performed: No ____________________________________________   INITIAL IMPRESSION / ASSESSMENT AND PLAN / ED COURSE  Pertinent labs & imaging results that were available during my care of the patient were reviewed by me and considered in my medical decision making (see chart for details).  Patient well-appearing in no acute distress.  We will send labs including coagulation parameters given snakebite.  No evidence of significant local inflammation, no evidence of systemic effects, will monitor in the emergency department  Clinical Course as of May 28 1527  Thu May 27, 2018  1359 Erythema has decreased   [RK]    Clinical Course User Index [RK] [name], Abeer Deskins, MD    ----------------------------------------- 3:28 PM on 05/27/2018 ----------------------------------------- Patient has no erythema, no swelling, he  feels quite well and would like to go home.  I feel this is reasonable because he agrees to return immediately if any swelling redness or worsening pain  ____________________________________________   FINAL CLINICAL IMPRESSION(S) / ED DIAGNOSES  Final diagnoses:  Snake bite, initial encounter        Note:  This document was prepared using Dragon voice recognition software and may include unintentional dictation errors.    [name], Zavannah Deblois, MD 05/27/18 [phone]

## 2018-05-27 NOTE — ED Triage Notes (Signed)
[facility] [employer] states was bit by copper head snake around 1130 today to left FA.  Redness noted to area.  Pt speaking in complete and coherent sentences, chest rise even and unlabored at this time.

## 2019-04-17 IMAGING — US US RENAL
1 series · 14 of 25 positions shown · non-contrast
Comparison: CT 07/28/2016

CLINICAL DATA: Right flank and right lower quadrant pain

EXAM:
RENAL / URINARY TRACT ULTRASOUND COMPLETE

[Series 1: us renal · 0.23mm/px · 14 of 31 slices shown]
[im 1/31]
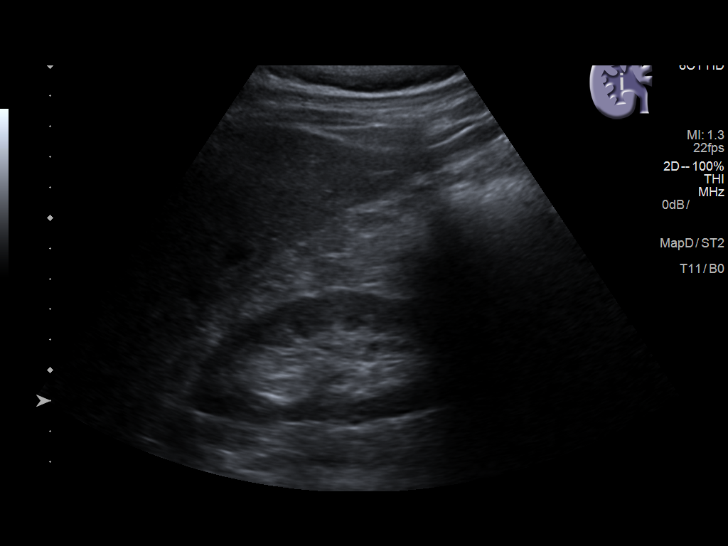
[im 3/31]
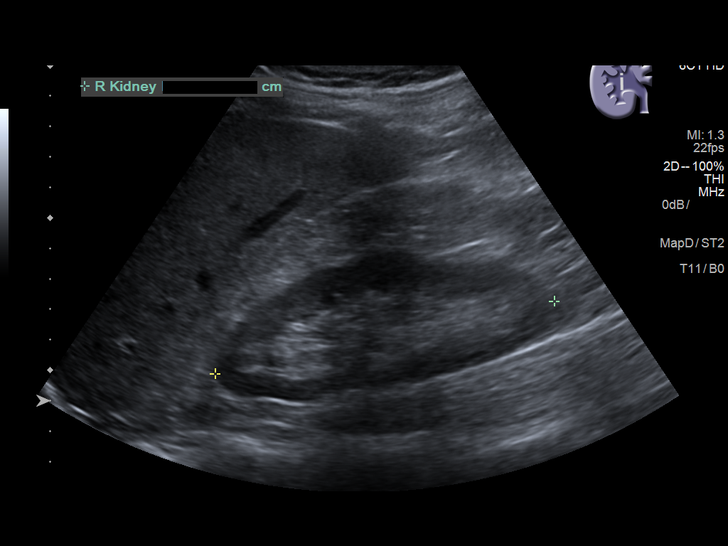
[im 6/31]
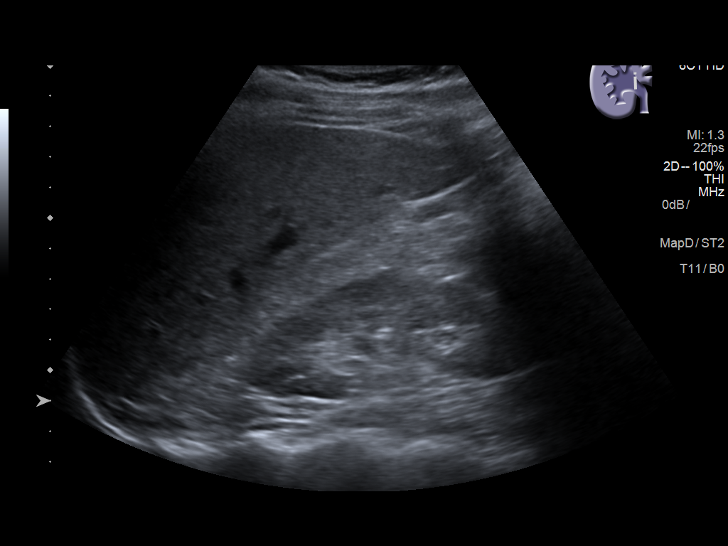
[im 8/31]
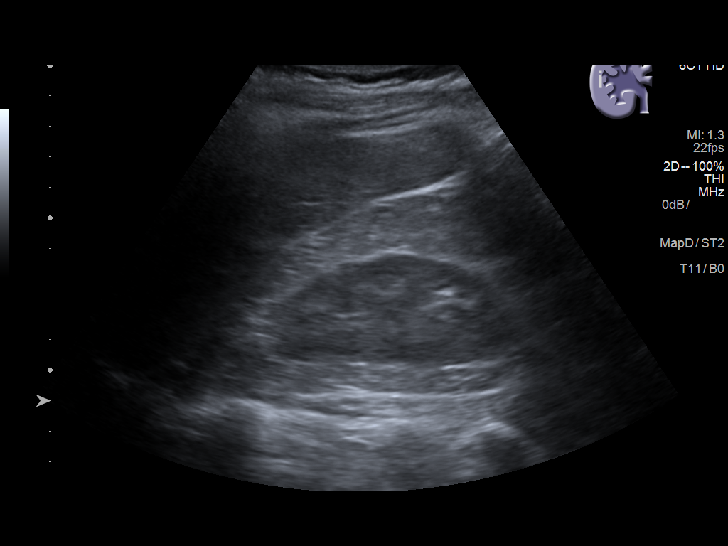
[im 11/31]
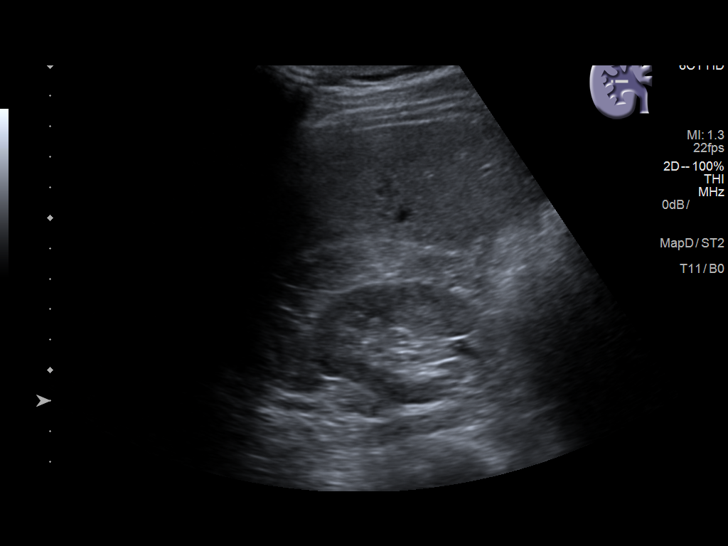
[im 12/31]
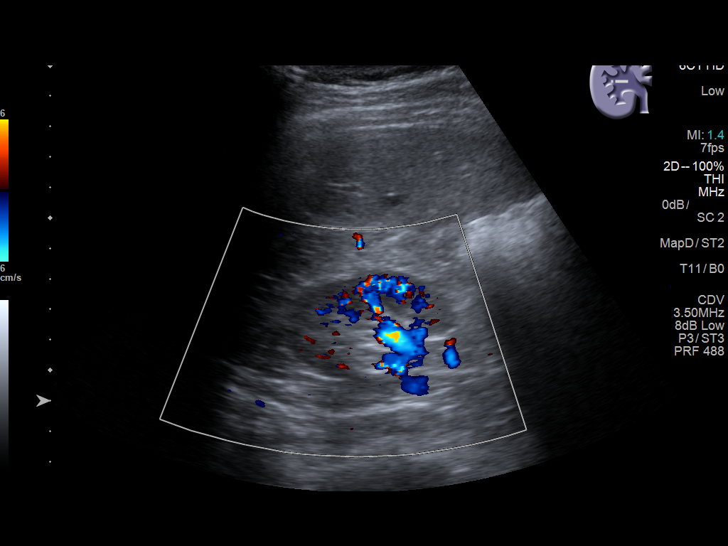
[im 14/31]
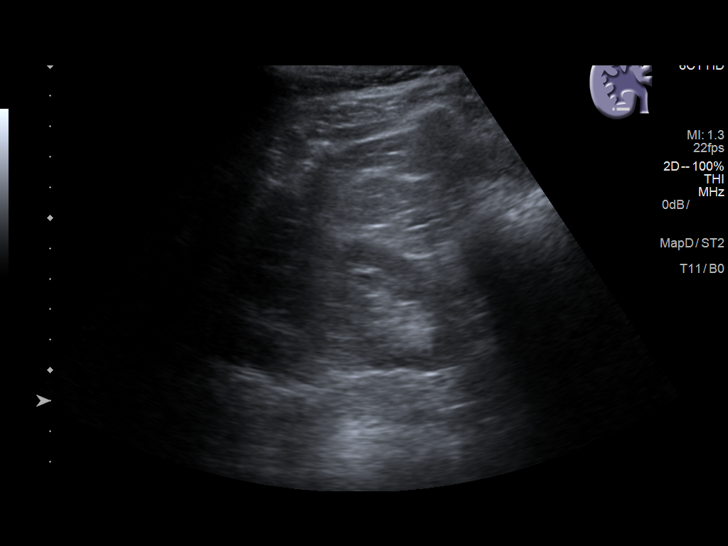
[im 17/31]
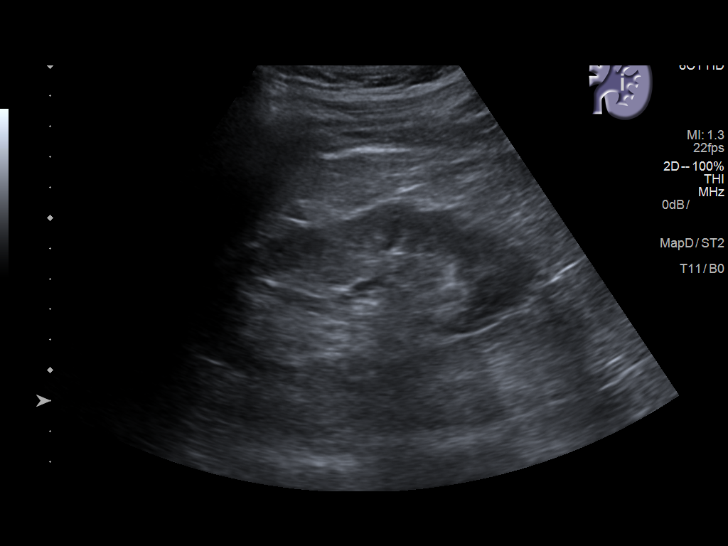
[im 19/31]
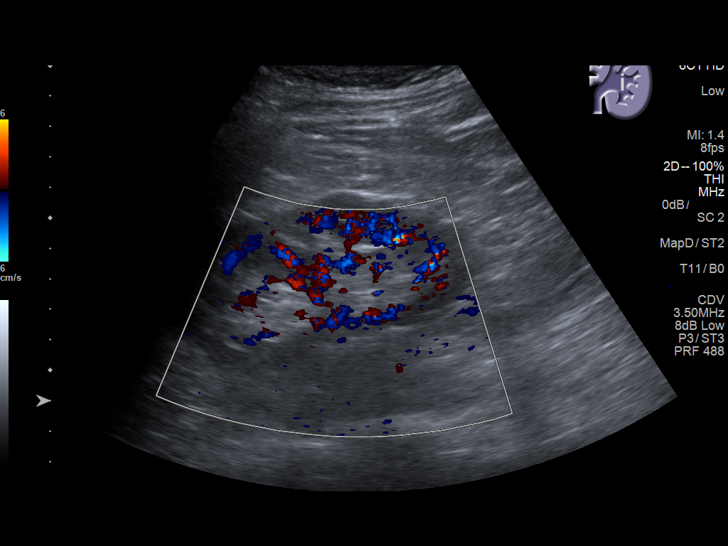
[im 21/31]
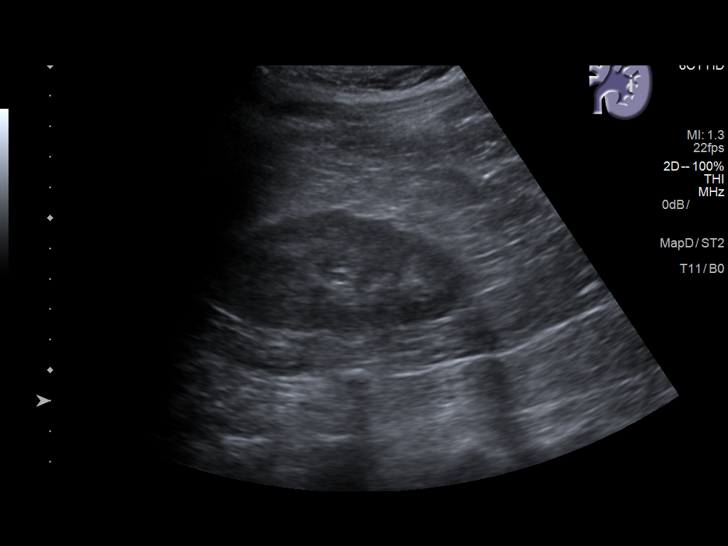
[im 23/31]
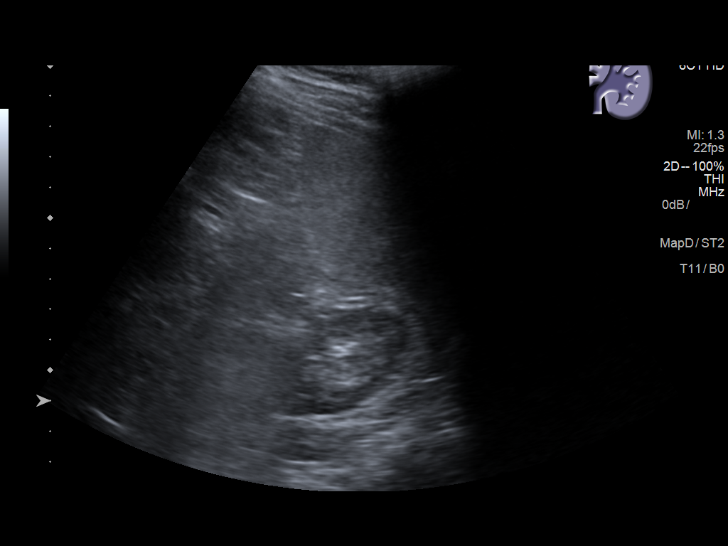
[im 26/31]
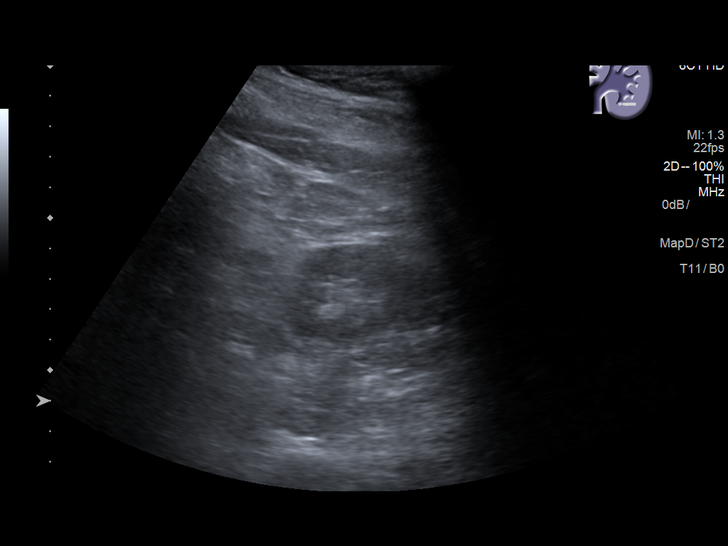
[im 28/31]
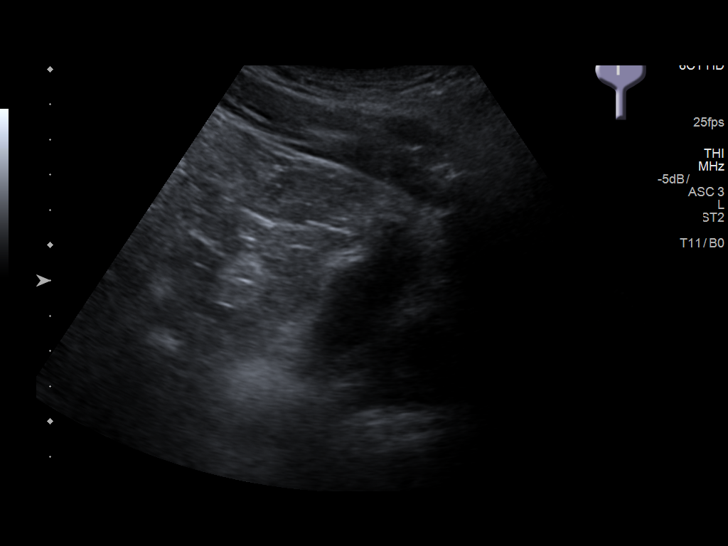
[im 31/31]
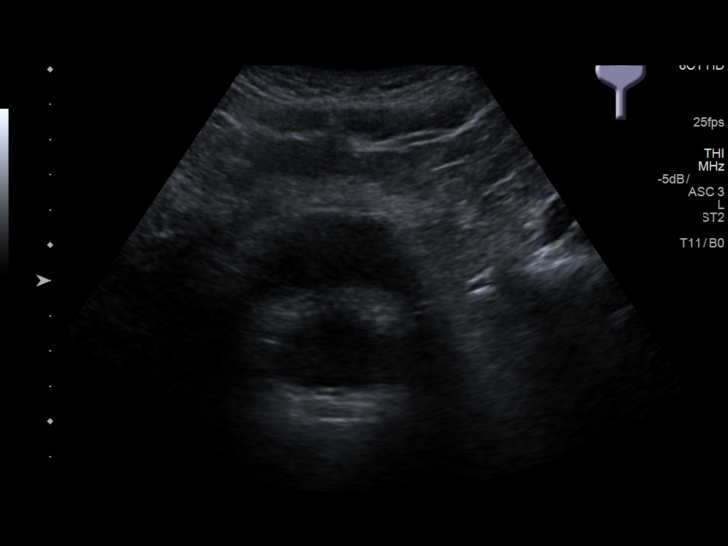

[14 of 25 positions shown; findings below may reference images not displayed]

FINDINGS: Right Kidney:

Length: 11.4 cm. Echogenicity within normal limits. No mass or
hydronephrosis visualized.

Left Kidney:

Length: 11.3 cm. Echogenicity within normal limits. No mass or
hydronephrosis visualized.

Bladder:

Appears normal for degree of bladder distention.
IMPRESSION: Normal renal ultrasound.

## 2019-07-14 IMAGING — CT CT RENAL STONE PROTOCOL
2 of 4 series · 16 of 46 positions shown, 18 images · non-contrast
Comparison: CT abdomen pelvis 07/28/2016

CLINICAL DATA: Flank pain

EXAM:
CT ABDOMEN AND PELVIS WITHOUT CONTRAST
TECHNIQUE: Multidetector CT imaging of the abdomen and pelvis was performed
following the standard protocol without IV contrast.

[Series 2: stone full standard · axial · 0.77mm/px · z∈[-660,-220]mm · 13 of 98 slices shown, 15 images]
[im 5/98  soft-tissue]
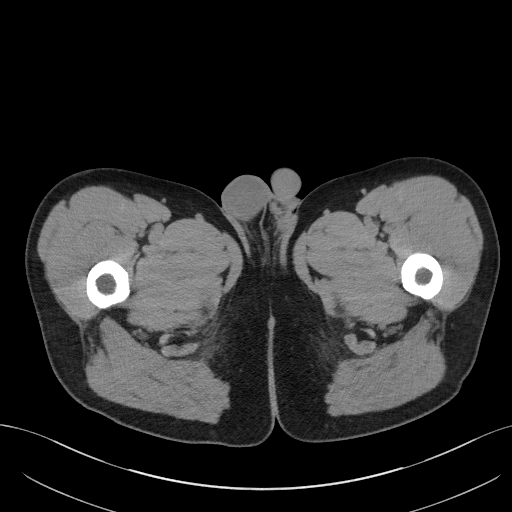
[im 5/98  bone]
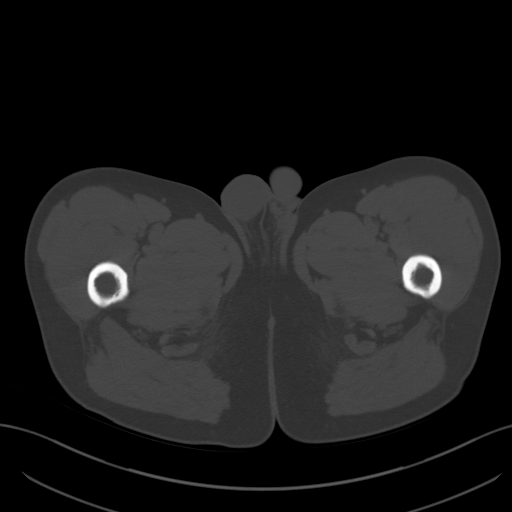
[im 13/98  soft-tissue]
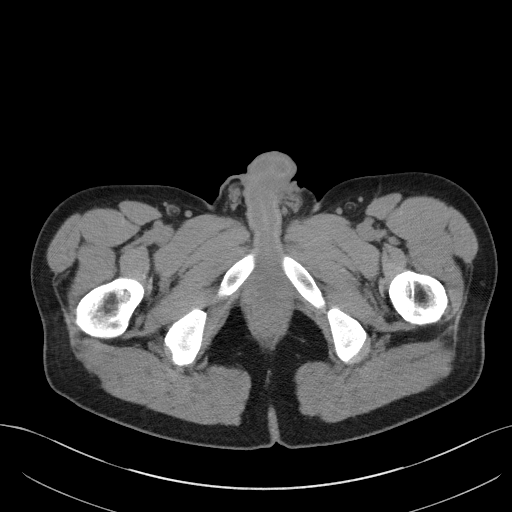
[im 22/98  soft-tissue]
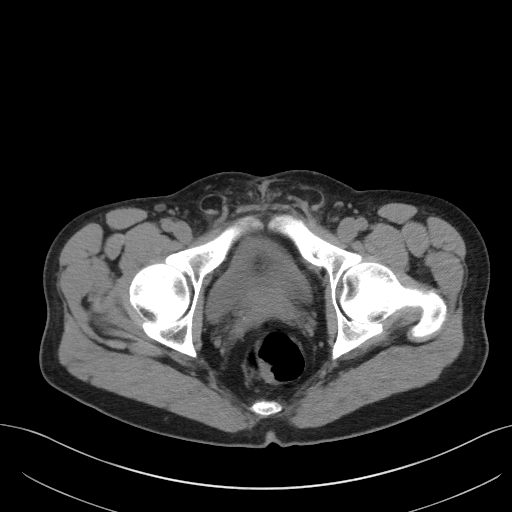
[im 26/98  soft-tissue]
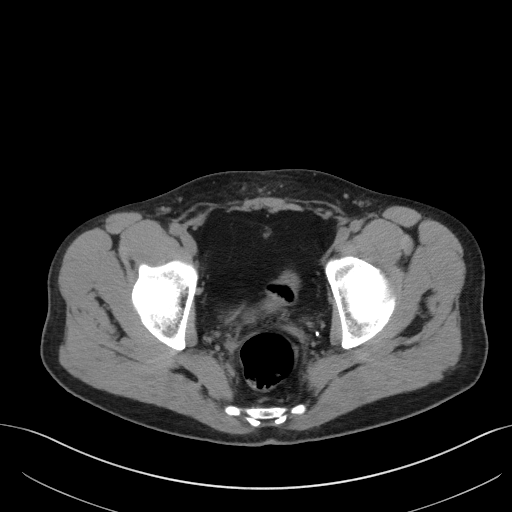
[im 34/98  soft-tissue]
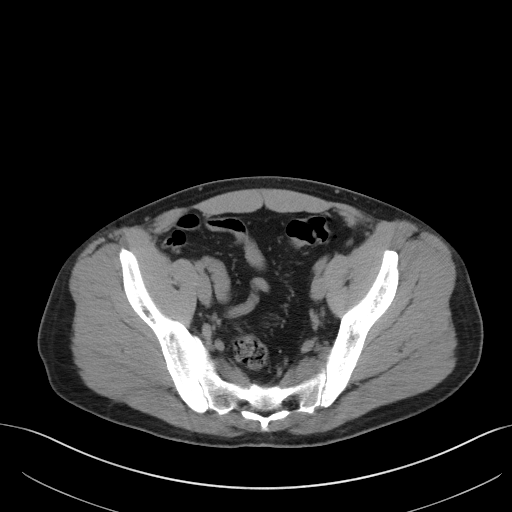
[im 43/98  soft-tissue]
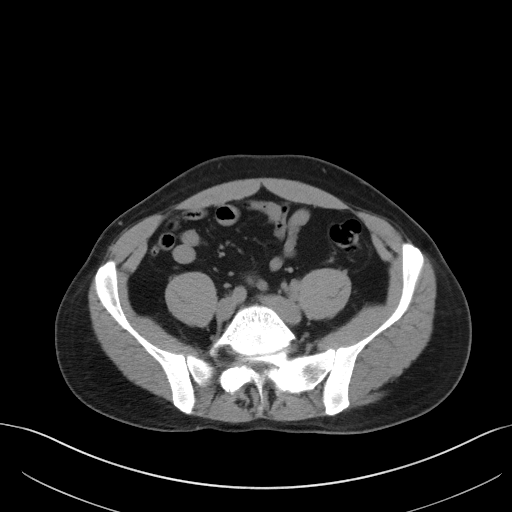
[im 51/98  soft-tissue]
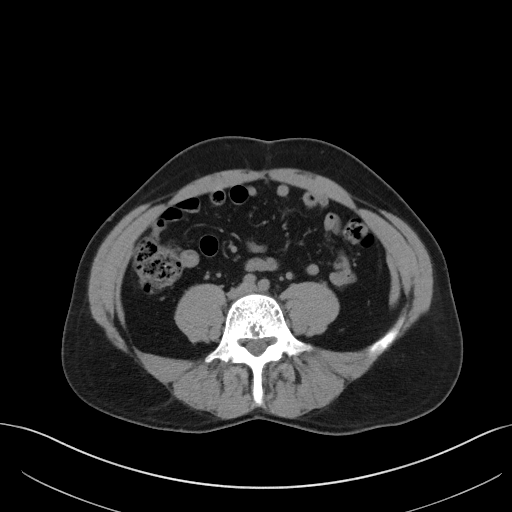
[im 55/98  soft-tissue]
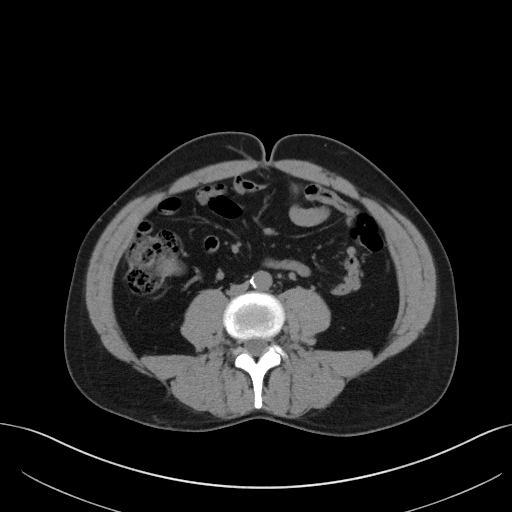
[im 64/98  soft-tissue]
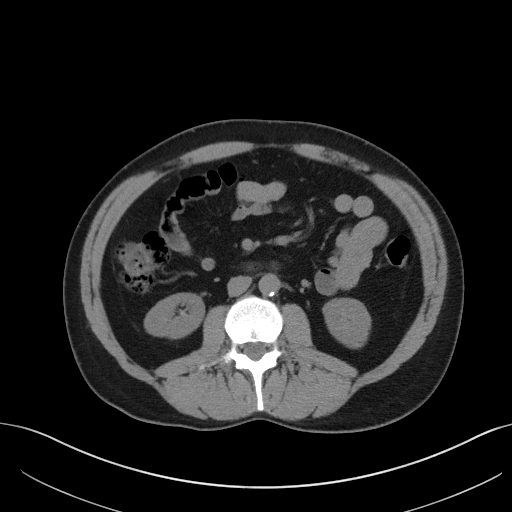
[im 64/98  bone]
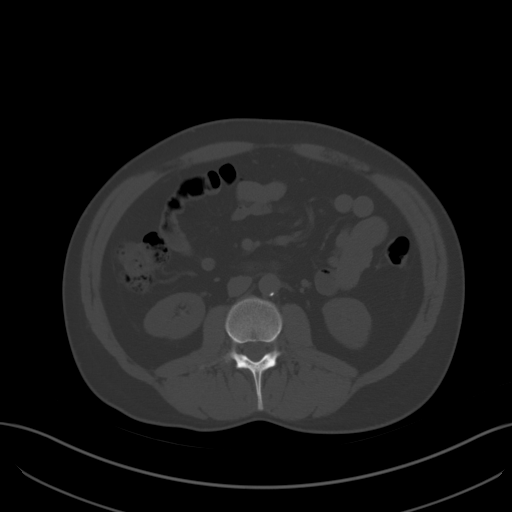
[im 72/98  soft-tissue]
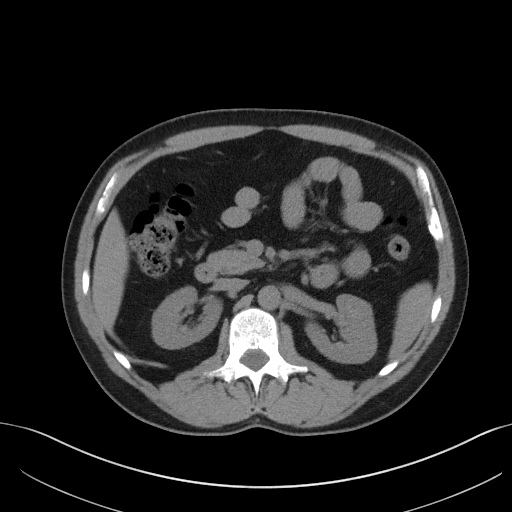
[im 76/98  soft-tissue]
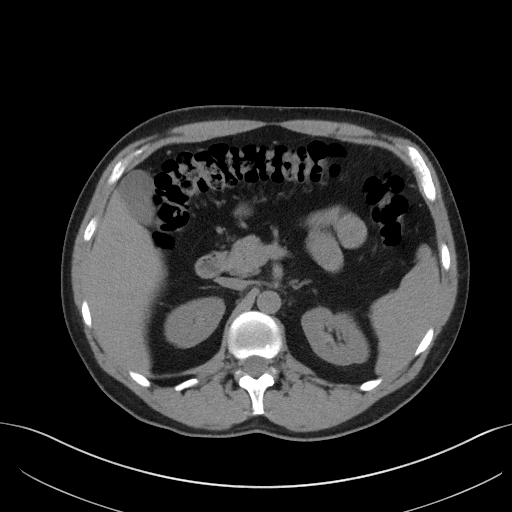
[im 85/98  soft-tissue]
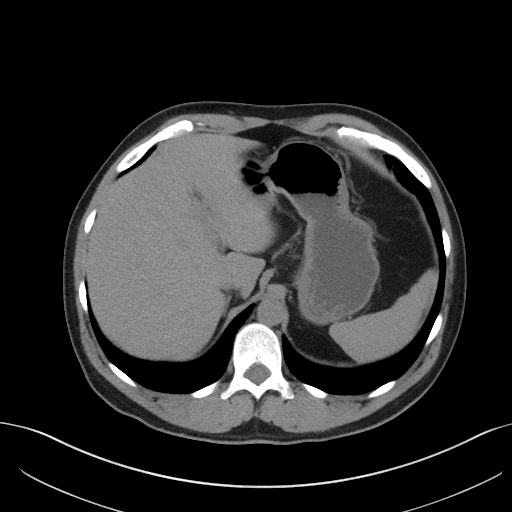
[im 93/98  soft-tissue]
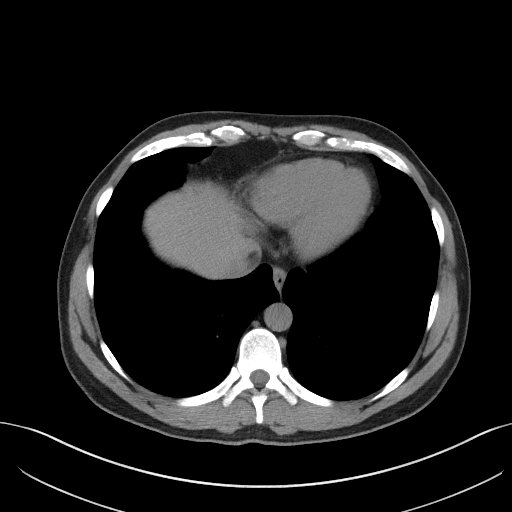

[Series 5: coronal · coronal · 0.73mm/px · 3 of 112 slices shown]
[im 38/112  soft-tissue]
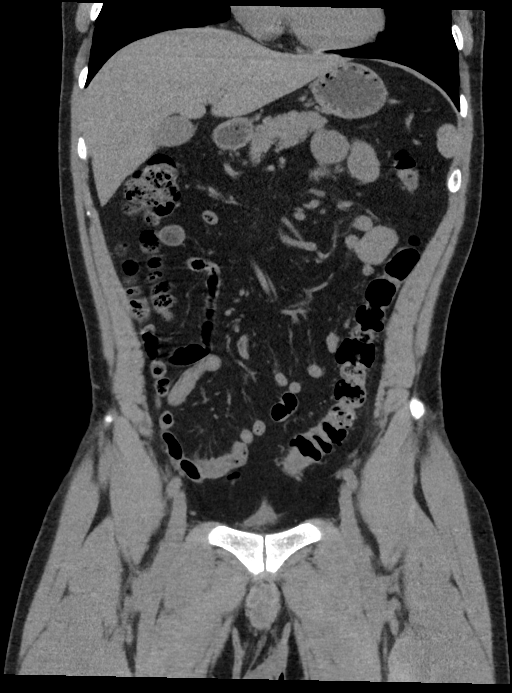
[im 50/112  soft-tissue]
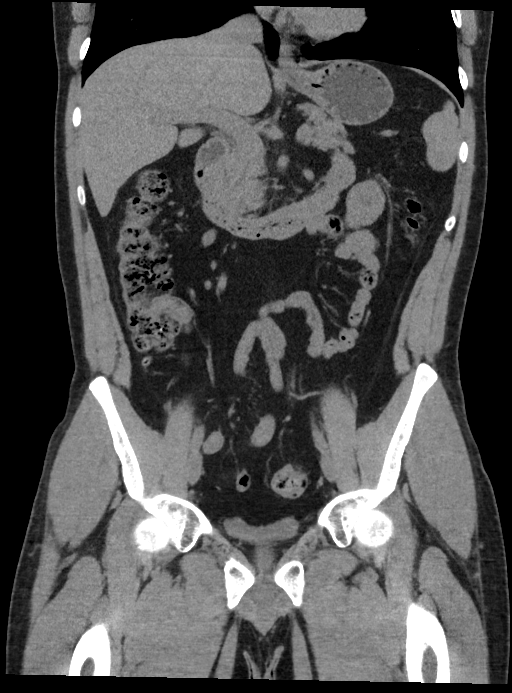
[im 62/112  soft-tissue]
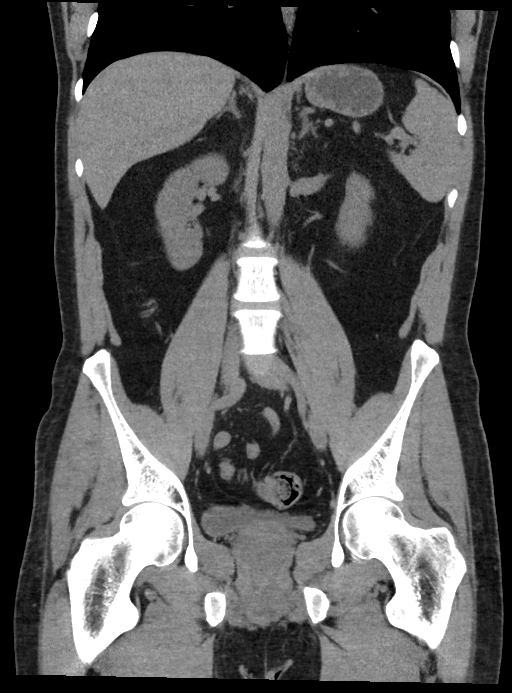

[16 of 46 positions shown; findings below may reference images not displayed]

FINDINGS: Lower chest: No basilar pulmonary nodules or pleural effusion. No
apical pericardial effusion.

Hepatobiliary: Normal hepatic contours and density. No visible
biliary dilatation. Normal gallbladder.

Pancreas: Normal parenchymal contours without ductal dilatation. No
peripancreatic fluid collection.

Spleen: Normal.

Adrenals/Urinary Tract:

--Adrenal glands: Normal.

--Right kidney/ureter: No hydronephrosis, perinephric stranding or
nephrolithiasis. No obstructing ureteral stones.

--Left kidney/ureter: 3mm left lower pole non-obstructive renal
stone.

--Urinary bladder: Normal appearance for the degree of distention.

Stomach/Bowel:

--Stomach/Duodenum: No hiatal hernia or other gastric abnormality.
Normal duodenal course.

--Small bowel: No dilatation or inflammation.

--Colon: No focal abnormality.

--Appendix: Normal.

Vascular/Lymphatic: Mild calcific atherosclerosis of the aorta. No
abdominal or pelvic lymphadenopathy.

Reproductive: Normal prostate and seminal vesicles.

Musculoskeletal. No bony spinal canal stenosis or focal osseous
abnormality.

Other: None.
IMPRESSION: 1. No obstructive uropathy or other acute abdominal or pelvic
abnormality.
2. Nonobstructive 3 mm left lower pole nephrolithiasis.
3. Mild calcific aortic atherosclerosis ([id number]).

## 2019-11-23 ENCOUNTER — Emergency Department
Admission: EM | Admit: 2019-11-23 | Discharge: 2019-11-23 | Disposition: A | Discharge status: Home / Self Care | Payer: Self-pay | Attending: Emergency Medicine | Admitting: Emergency Medicine

## 2019-11-23 ENCOUNTER — Other Ambulatory Visit: Payer: Self-pay

## 2019-11-23 ENCOUNTER — Emergency Department: Payer: Self-pay

## 2019-11-23 ENCOUNTER — Encounter: Payer: Self-pay | Admitting: Emergency Medicine

## 2019-11-23 DIAGNOSIS — R0602 Shortness of breath: Secondary | ICD-10-CM | POA: Insufficient documentation

## 2019-11-23 DIAGNOSIS — R202 Paresthesia of skin: Secondary | ICD-10-CM

## 2019-11-23 DIAGNOSIS — R0789 Other chest pain: Secondary | ICD-10-CM

## 2019-11-23 DIAGNOSIS — R002 Palpitations: Secondary | ICD-10-CM

## 2019-11-23 DIAGNOSIS — F17210 Nicotine dependence, cigarettes, uncomplicated: Secondary | ICD-10-CM | POA: Insufficient documentation

## 2019-11-23 LAB — CBC
HCT: 44.8 % (ref 39.0–52.0)
Hemoglobin: 15.6 g/dL (ref 13.0–17.0)
MCH: 30.8 pg (ref 26.0–34.0)
MCHC: 34.8 g/dL (ref 30.0–36.0)
MCV: 88.5 fL (ref 80.0–100.0)
Platelets: 233 10*3/uL (ref 150–400)
RBC: 5.06 MIL/uL (ref 4.22–5.81)
RDW: 12 % (ref 11.5–15.5)
WBC: 6.3 10*3/uL (ref 4.0–10.5)
nRBC: 0 % (ref 0.0–0.2)

## 2019-11-23 LAB — BASIC METABOLIC PANEL
Anion gap: 8 (ref 5–15)
BUN: 15 mg/dL (ref 6–20)
CO2: 27 mmol/L (ref 22–32)
Calcium: 9.4 mg/dL (ref 8.9–10.3)
Chloride: 104 mmol/L (ref 98–111)
Creatinine, Ser: 0.99 mg/dL (ref 0.61–1.24)
GFR calc Af Amer: >60 mL/min (ref >60)
GFR calc non Af Amer: >60 mL/min (ref >60)
Glucose, Bld: 89 mg/dL (ref 70–99)
Potassium: 4 mmol/L (ref 3.5–5.1)
Sodium: 139 mmol/L (ref 135–145)

## 2019-11-23 LAB — TROPONIN I (HIGH SENSITIVITY)
Troponin I (High Sensitivity): 4 ng/L (ref <18)
Troponin I (High Sensitivity): 4 ng/L (ref <18)

## 2019-11-23 NOTE — ED Triage Notes (Signed)
Pt reports pressure like cp to mid chest, non radiating that started about 30 minutes ago. Pt reports that he had to take a knee because he felt like he was going to pass out.

## 2019-11-23 NOTE — ED Provider Notes (Signed)
[facility] Emergency Department Provider Note   ____________________________________________   First MD Initiated Contact with Patient 11/23/19 1321     (approximate)  I have reviewed the triage vital signs and the nursing notes.   HISTORY  Chief Complaint Chest Pain and Shortness of Breath    HPI Arthur Kent is a 49 y.o. male with past medical history of kidney stones who presents to the ED complaining of chest pain.  Patient reports that he initially had an episode yesterday of sudden onset chest tightness while driving home from work.  He felt like his heart was racing at the time and had tingling extending into both of his arms.  Symptoms seem to resolve after a few minutes, but he then had a similar episode again today while driving home from work.  He felt more lightheaded during the episode today and so decided to get checked out.  He states all pain and tingling has resolved, but he still feels slightly lightheaded.  He denies any cardiac history, but does smoke about 1 pack/week of cigarettes.  He does admit that he has been under a lot of stress at work recently        Past Medical History:  Diagnosis Date  . Kidney stone     There are no problems to display for this patient.   Past Surgical History:  Procedure Laterality Date  . FACIAL COSMETIC SURGERY      Prior to Admission medications   Medication Sig Start Date End Date Taking? Authorizing Provider  diazepam (VALIUM) 5 MG tablet Take 1 tablet (5 mg total) by mouth every 8 (eight) hours as needed for muscle spasms. 11/24/17   Emily Filbert, MD  dicyclomine (BENTYL) 20 MG tablet Take 1 tablet (20 mg total) by mouth every 6 (six) hours as needed. 07/28/16   Irean Hong, MD  HYDROcodone-acetaminophen (NORCO/VICODIN) 5-325 MG tablet Take 1-2 tablets by mouth every 6 (six) hours as needed for moderate pain. 11/20/17   Governor Rooks, MD  ibuprofen (ADVIL,MOTRIN) 800 MG tablet  Take 1 tablet (800 mg total) by mouth every 8 (eight) hours as needed for moderate pain. 07/28/16   Irean Hong, MD  ibuprofen (ADVIL,MOTRIN) 800 MG tablet Take 1 tablet (800 mg total) by mouth every 8 (eight) hours as needed. 11/24/17   Emily Filbert, MD  ketorolac (TORADOL) 10 MG tablet Take 1 tablet (10 mg total) by mouth every 8 (eight) hours as needed for moderate pain. 11/20/17   Governor Rooks, MD  lactulose (CHRONULAC) 10 GM/15ML solution Take 30 mLs (20 g total) by mouth daily as needed for mild constipation. 07/28/16   Irean Hong, MD  meclizine (ANTIVERT) 25 MG tablet Take 1 tablet (25 mg total) by mouth 3 (three) times daily as needed for dizziness. 10/17/16   Loleta Rose, MD  naproxen (NAPROSYN) 500 MG tablet Take 1 tablet (500 mg total) by mouth 2 (two) times daily with a meal. 07/22/16   Jene Every, MD  ondansetron (ZOFRAN) 4 MG tablet Take 1 tablet (4 mg total) by mouth every 8 (eight) hours as needed for nausea or vomiting. 11/20/17   Governor Rooks, MD  tamsulosin (FLOMAX) 0.4 MG CAPS capsule Take 1 capsule (0.4 mg total) by mouth daily. 11/20/17   Governor Rooks, MD    Allergies Codeine, Oxycodone, and Percocet [oxycodone-acetaminophen]  No family history on file.  Social History Social History   Tobacco Use  . Smoking status: Current  Every Day Smoker  . Smokeless tobacco: Never Used  Substance Use Topics  . Alcohol use: Yes    Comment: occasionally  . Drug use: Never    Review of Systems  Constitutional: No fever/chills Eyes: No visual changes. ENT: No sore throat. Cardiovascular: Positive for chest pain.  Positive for lightheadedness. Respiratory: Positive for shortness of breath. Gastrointestinal: No abdominal pain.  No nausea, no vomiting.  No diarrhea.  No constipation. Genitourinary: Negative for dysuria. Musculoskeletal: Negative for back pain. Skin: Negative for rash. Neurological: Negative for headaches, focal weakness or numbness.  Positive for  tingling.  ____________________________________________   PHYSICAL EXAM:  VITAL SIGNS: ED Triage Vitals  Enc Vitals Group     BP 11/23/19 1130 109/71     Pulse Rate 11/23/19 1130 76     Resp 11/23/19 1130 18     Temp 11/23/19 1130 (!) 97.5 F (36.4 C)     Temp Source 11/23/19 1130 Oral     SpO2 11/23/19 1130 100 %     Weight 11/23/19 1119 175 lb (79.4 kg)     Height 11/23/19 1119 5\' 10"  (1.778 m)     Head Circumference --      Peak Flow --      Pain Score 11/23/19 1119 8     Pain Loc --      Pain Edu? --      Excl. in [place]? --     Constitutional: Alert and oriented. Eyes: Conjunctivae are normal. Head: Atraumatic. Nose: No congestion/rhinnorhea. Mouth/Throat: Mucous membranes are moist. Neck: Normal ROM Cardiovascular: Normal rate, regular rhythm. Grossly normal heart sounds.  2+ radial pulses bilaterally. Respiratory: Normal respiratory effort.  No retractions. Lungs CTAB. Gastrointestinal: Soft and nontender. No distention. Genitourinary: deferred Musculoskeletal: No lower extremity tenderness nor edema. Neurologic:  Normal speech and language. No gross focal neurologic deficits are appreciated. Skin:  Skin is warm, dry and intact. No rash noted. Psychiatric: Mood and affect are normal. Speech and behavior are normal.  ____________________________________________   LABS (all labs ordered are listed, but only abnormal results are displayed)  Labs Reviewed  BASIC METABOLIC PANEL  CBC  TROPONIN I (HIGH SENSITIVITY)  TROPONIN I (HIGH SENSITIVITY)   ____________________________________________  EKG  ED ECG REPORT I, Blake Divine, the attending physician, personally viewed and interpreted this ECG.   Date: 11/23/2019  EKG Time: 11:26  Rate: 72  Rhythm: normal sinus rhythm  Axis: Normal  Intervals:none  ST&T Change: None   PROCEDURES  Procedure(s) performed (including Critical Care):  Procedures   ____________________________________________    INITIAL IMPRESSION / ASSESSMENT AND PLAN / ED COURSE       49 year old male presents to the ED following 2 episodes on consecutive days of chest tightness, difficulty catching his breath, and tingling extending into his arms and face.  He describes some palpitation with this episode, noted to be in normal sinus rhythm on both EKG and monitor.  No evidence of ischemic changes and troponin within normal limits.  Remainder of lab work is unremarkable, chest x-ray negative for acute process.  Given his heart score of less than 4, he will be appropriate for discharge home if repeat troponin within normal limits.  Pain is since resolved and I doubt PE or dissection.  I have recommended that he follow-up with his PCP for consideration of Holter monitor, otherwise return to the ED for new or worsening symptoms.  Repeat troponin within normal limits, patient appropriate for discharge home with PCP follow-up, otherwise counseled  to return to the ED for new or worsening symptoms.      ____________________________________________   FINAL CLINICAL IMPRESSION(S) / ED DIAGNOSES  Final diagnoses:  Atypical chest pain  Palpitations  Paresthesia     ED Discharge Orders    None       Note:  This document was prepared using Dragon voice recognition software and may include unintentional dictation errors.   Chesley Noon, MD 11/23/19 [phone]

## 2020-07-04 ENCOUNTER — Other Ambulatory Visit: Payer: Self-pay

## 2020-07-04 DIAGNOSIS — Z20822 Contact with and (suspected) exposure to covid-19: Secondary | ICD-10-CM

## 2020-07-05 LAB — SARS-COV-2, NAA 2 DAY TAT

## 2020-07-05 LAB — NOVEL CORONAVIRUS, NAA: SARS-CoV-2, NAA: NOT DETECTED

## 2020-08-09 ENCOUNTER — Other Ambulatory Visit: Payer: Self-pay

## 2021-07-12 IMAGING — CR DG CHEST 2V
1 series · 2 of 2 positions shown · non-contrast
Comparison: None.

CLINICAL DATA: Chest pain.

EXAM:
CHEST - 2 VIEW

[Series 1: dg chest 2 view · 0.14mm/px · 2 of 2 slices shown]
[im 1/2]
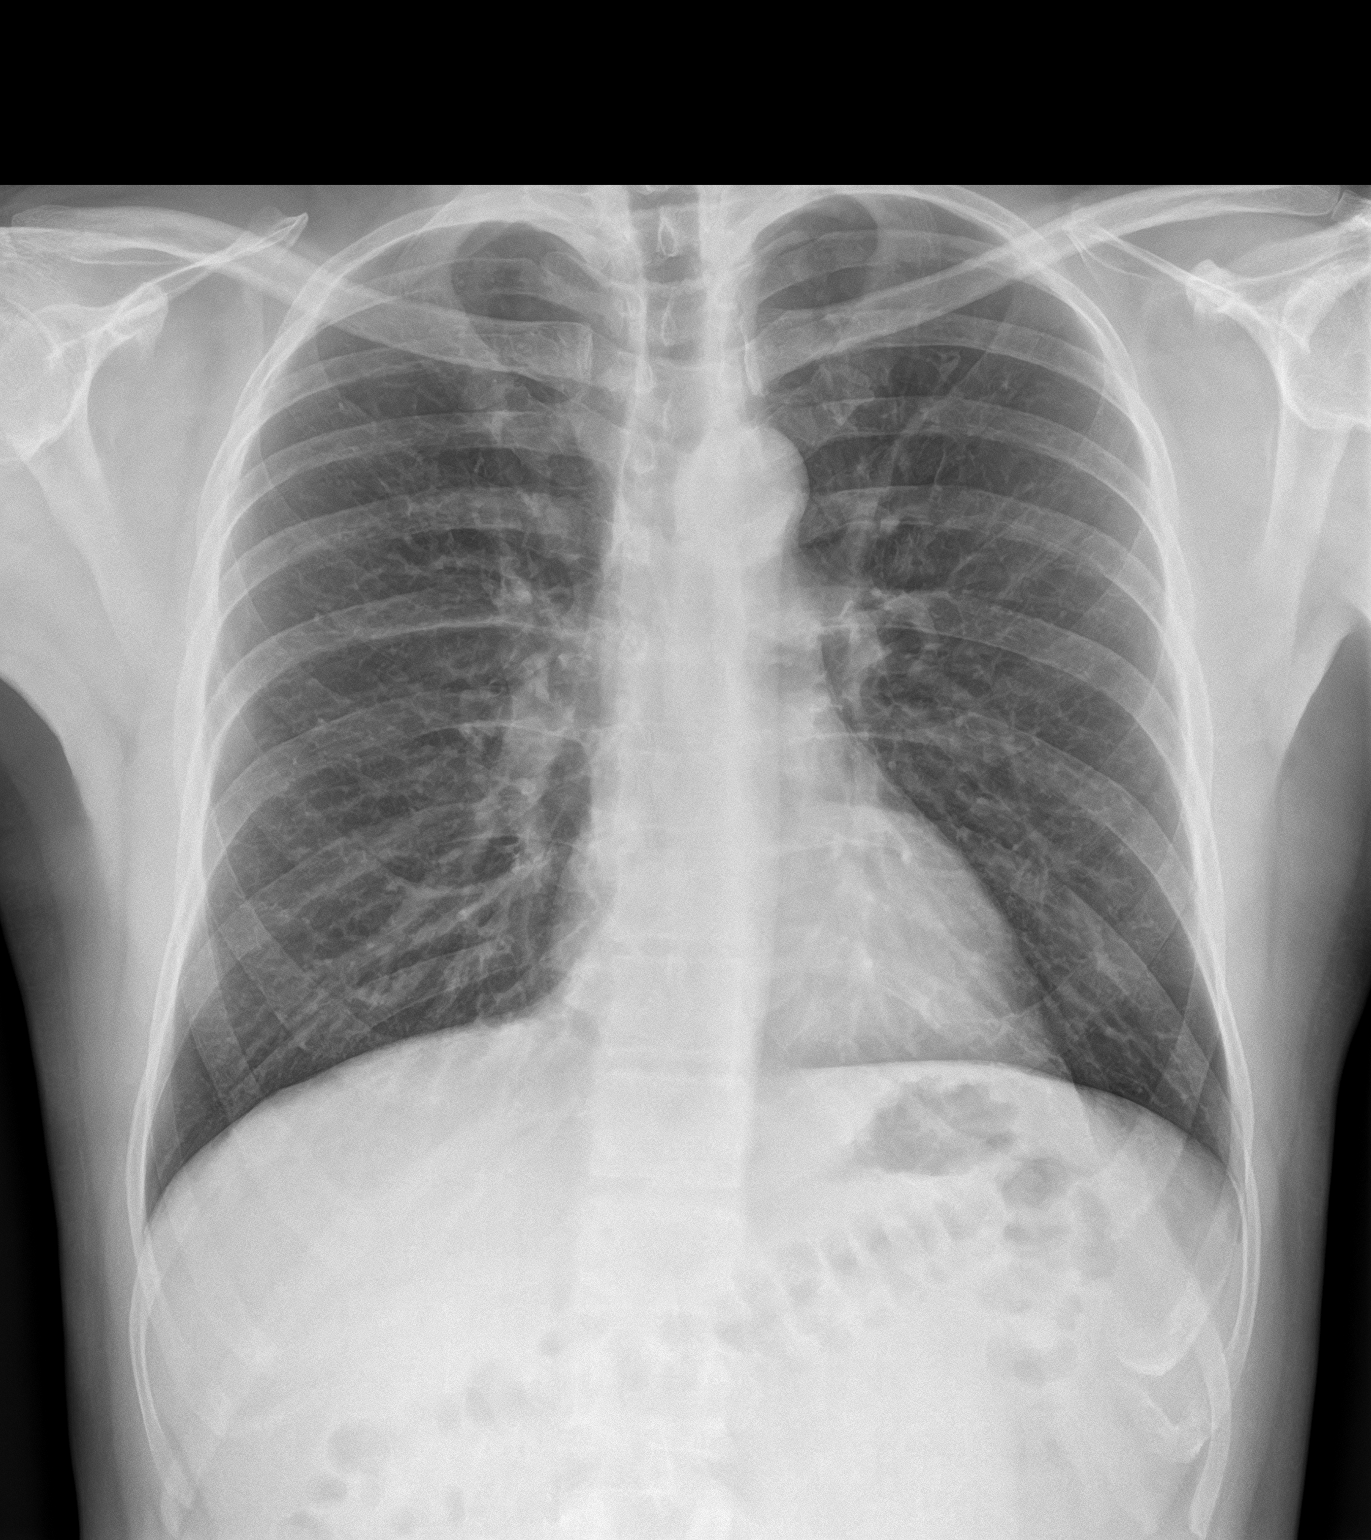
[im 2/2]
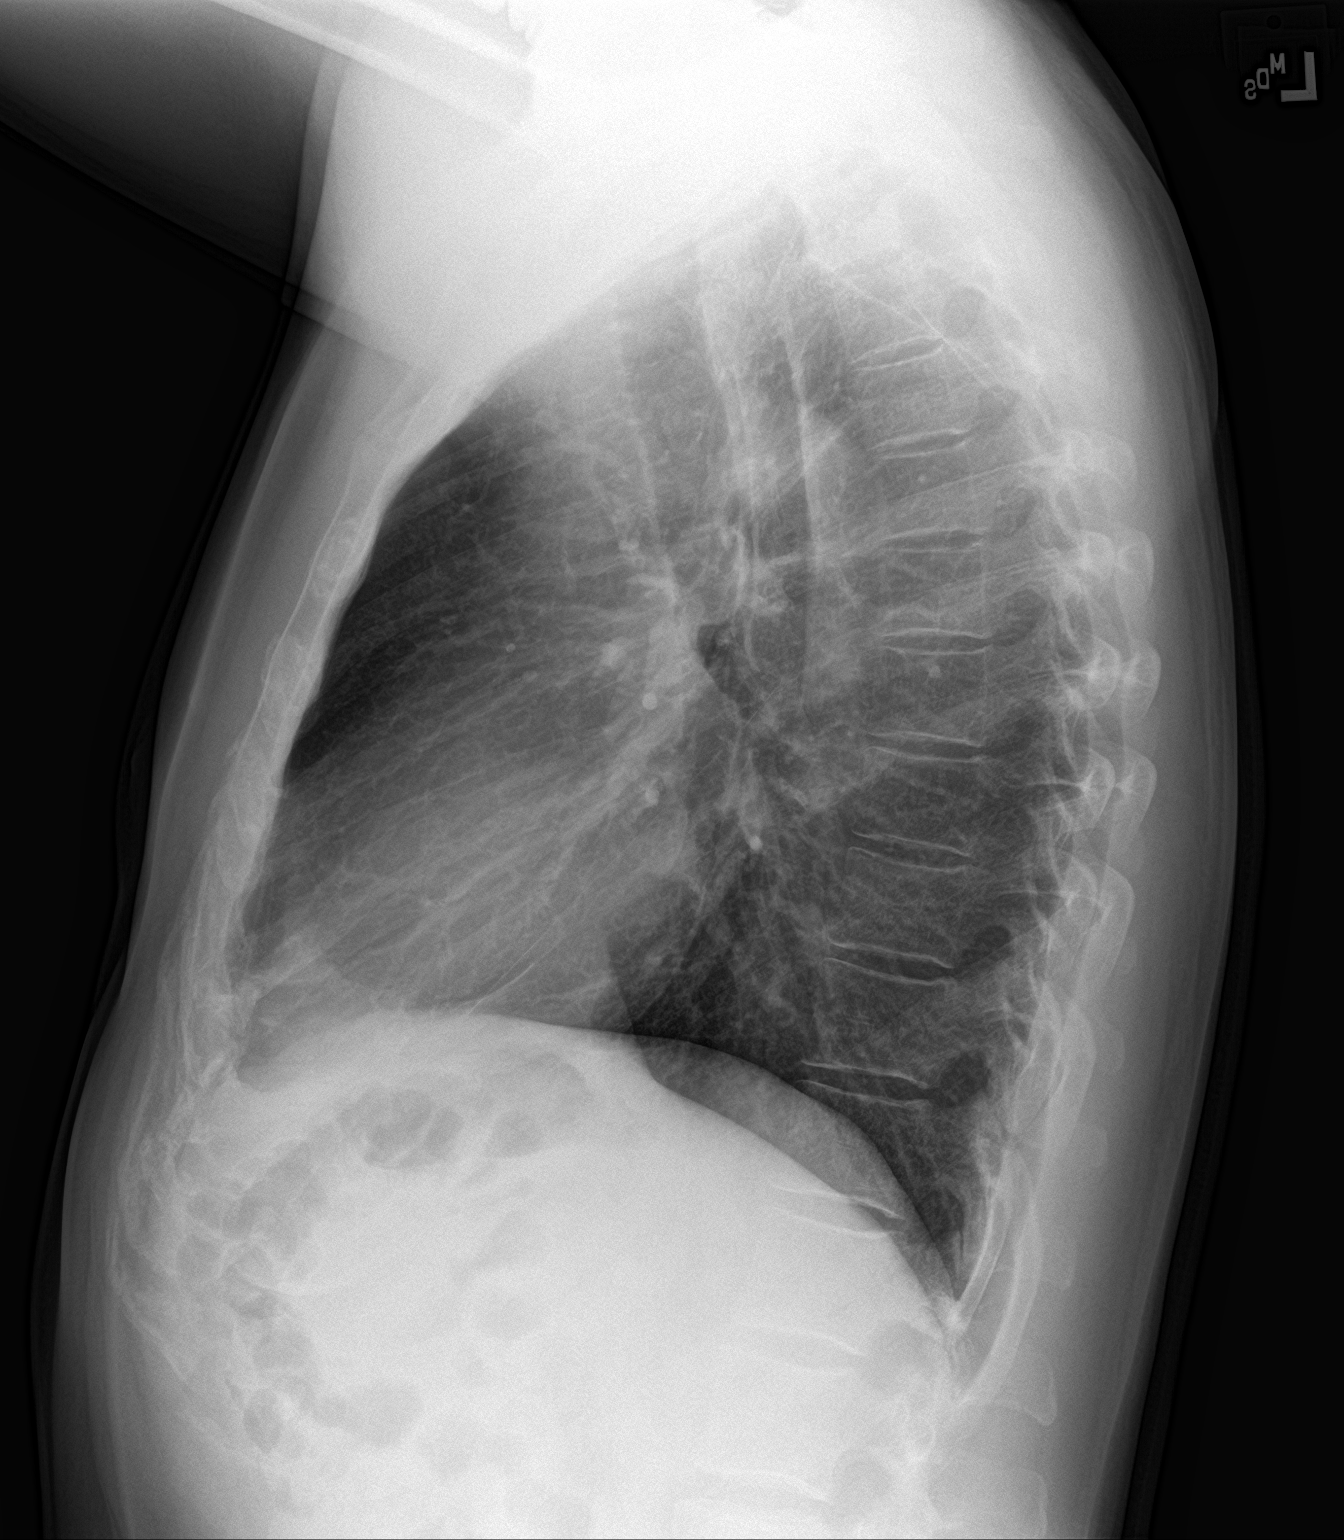

[2 of 2 positions shown; findings below may reference images not displayed]

FINDINGS: The heart size and mediastinal contours are within normal limits.
Both lungs are clear. No pneumothorax or pleural effusion is noted.
The visualized skeletal structures are unremarkable.
IMPRESSION: No active cardiopulmonary disease.

## 2022-05-25 ENCOUNTER — Emergency Department: Payer: BC Managed Care – PPO

## 2022-05-25 ENCOUNTER — Emergency Department
Admission: EM | Admit: 2022-05-25 | Discharge: 2022-05-25 | Disposition: A | Discharge status: Home / Self Care | Payer: BC Managed Care – PPO | Attending: Emergency Medicine | Admitting: Emergency Medicine

## 2022-05-25 ENCOUNTER — Other Ambulatory Visit: Payer: Self-pay

## 2022-05-25 DIAGNOSIS — R4701 Aphasia: Secondary | ICD-10-CM

## 2022-05-25 DIAGNOSIS — G43009 Migraine without aura, not intractable, without status migrainosus: Secondary | ICD-10-CM

## 2022-05-25 DIAGNOSIS — R29818 Other symptoms and signs involving the nervous system: Secondary | ICD-10-CM | POA: Diagnosis not present

## 2022-05-25 DIAGNOSIS — G43809 Other migraine, not intractable, without status migrainosus: Secondary | ICD-10-CM | POA: Insufficient documentation

## 2022-05-25 DIAGNOSIS — R7309 Other abnormal glucose: Secondary | ICD-10-CM | POA: Diagnosis not present

## 2022-05-25 DIAGNOSIS — I639 Cerebral infarction, unspecified: Secondary | ICD-10-CM | POA: Insufficient documentation

## 2022-05-25 LAB — DIFFERENTIAL
Abs Immature Granulocytes: 0.02 10*3/uL (ref 0.00–0.07)
Basophils Absolute: 0.1 10*3/uL (ref 0.0–0.1)
Basophils Relative: 1 %
Eosinophils Absolute: 0.5 10*3/uL (ref 0.0–0.5)
Eosinophils Relative: 7 %
Immature Granulocytes: 0 %
Lymphocytes Relative: 27 %
Lymphs Abs: 1.9 10*3/uL (ref 0.7–4.0)
Monocytes Absolute: 0.5 10*3/uL (ref 0.1–1.0)
Monocytes Relative: 6 %
Neutro Abs: 4.1 10*3/uL (ref 1.7–7.7)
Neutrophils Relative %: 59 %

## 2022-05-25 LAB — CBC
HCT: 44.4 % (ref 39.0–52.0)
Hemoglobin: 15.2 g/dL (ref 13.0–17.0)
MCH: 30.8 pg (ref 26.0–34.0)
MCHC: 34.2 g/dL (ref 30.0–36.0)
MCV: 90.1 fL (ref 80.0–100.0)
Platelets: 214 10*3/uL (ref 150–400)
RBC: 4.93 MIL/uL (ref 4.22–5.81)
RDW: 11.9 % (ref 11.5–15.5)
WBC: 7 10*3/uL (ref 4.0–10.5)
nRBC: 0 % (ref 0.0–0.2)

## 2022-05-25 LAB — COMPREHENSIVE METABOLIC PANEL
ALT: 29 U/L (ref 0–44)
AST: 24 U/L (ref 15–41)
Albumin: 4.2 g/dL (ref 3.5–5.0)
Alkaline Phosphatase: 73 U/L (ref 38–126)
Anion gap: 6 (ref 5–15)
BUN: 15 mg/dL (ref 6–20)
CO2: 25 mmol/L (ref 22–32)
Calcium: 8.8 mg/dL — ABNORMAL LOW (ref 8.9–10.3)
Chloride: 109 mmol/L (ref 98–111)
Creatinine, Ser: 1.05 mg/dL (ref 0.61–1.24)
GFR, Estimated: >60 mL/min (ref >60)
Glucose, Bld: 120 mg/dL — ABNORMAL HIGH (ref 70–99)
Potassium: 3.9 mmol/L (ref 3.5–5.1)
Sodium: 140 mmol/L (ref 135–145)
Total Bilirubin: 0.5 mg/dL (ref 0.3–1.2)
Total Protein: 6.8 g/dL (ref 6.5–8.1)

## 2022-05-25 LAB — PROTIME-INR
INR: 1 (ref 0.8–1.2)
Prothrombin Time: 13.3 seconds (ref 11.4–15.2)

## 2022-05-25 LAB — ETHANOL: Alcohol, Ethyl (B): <10 mg/dL (ref <10)

## 2022-05-25 LAB — CBG MONITORING, ED: Glucose-Capillary: 133 mg/dL — ABNORMAL HIGH (ref 70–99)

## 2022-05-25 LAB — APTT: aPTT: 27 seconds (ref 24–36)

## 2022-05-25 MED ORDER — IOHEXOL 350 MG/ML SOLN
75.0000 mL | Freq: Once | INTRAVENOUS | Status: AC | PRN
  Administered 2022-05-25: 75 mL via INTRAVENOUS

## 2022-05-25 MED ORDER — SODIUM CHLORIDE 0.9 % IV BOLUS
1000.0000 mL | Freq: Once | INTRAVENOUS | Status: AC
  Administered 2022-05-25: 1000 mL via INTRAVENOUS

## 2022-05-25 MED ORDER — PROCHLORPERAZINE EDISYLATE 10 MG/2ML IJ SOLN
10.0000 mg | Freq: Once | INTRAMUSCULAR | Status: AC
  Administered 2022-05-25: 10 mg via INTRAVENOUS
  Filled 2022-05-25: qty 2

## 2022-05-25 MED ORDER — DIPHENHYDRAMINE HCL 50 MG/ML IJ SOLN
25.0000 mg | Freq: Once | INTRAMUSCULAR | Status: AC
  Administered 2022-05-25: 25 mg via INTRAVENOUS
  Filled 2022-05-25: qty 1

## 2022-05-25 MED ORDER — SUMATRIPTAN SUCCINATE 50 MG PO TABS: 50.0000 mg | ORAL_TABLET | Freq: Once | ORAL | 0 refills | Status: DC | PRN

## 2022-05-25 MED ORDER — SODIUM CHLORIDE 0.9% FLUSH: 3.0000 mL | Freq: Once | INTRAVENOUS | Status: DC

## 2022-05-25 NOTE — Consult Note (Signed)
[facility] Telemedicine Consult  Requesting Provider: Aliene Beams Consult Participants: Patient, nurse   This consult was provided via telemedicine with 2-way video and audio communication. The patient/family was informed that care would be provided in this way and agreed to receive care in this manner.    Chief Complaint: Visual change, difficulty speaking  HPI: 51 yo M with a history of occasional headaches who presents with visual change and difficulty speaking.  He was in his normal state of health earlier today when symptom between 530 and six he began having trouble with his vision.  He then also began having trouble speaking.  He states that he was able to understand, and able to write but unable to form verbal words.  He states that he sees zigzag lines when he closes his eyes.  His symptoms are improving, and he has a bilateral retro-orbital headache associated with photophobia.    LKW: 5:30 PM tpa given?: No, mild symptoms IR Thrombectomy? No, mild symptoms Modified Rankin Scale: 0-Completely asymptomatic and back to baseline post- stroke Time of teleneurologist evaluation: 7 PM  Exam: Vitals:   05/25/22 1848  BP: 139/83  Pulse: (!) 103  Resp: 18  Temp: 98.3 F (36.8 C)  SpO2: 94%    General: in bed, nad  1A: Level of Consciousness - 0 1B: Ask Month and Age - 1(gave age as 49) 1C: 'Blink Eyes' & 'Squeeze Hands' - 0 2: Test Horizontal Extraocular Movements - 0 3: Test Visual Fields - 0 4: Test Facial Palsy - 0 5A: Test Left Arm Motor Drift - 0 5B: Test Right Arm Motor Drift - 0 6A: Test Left Leg Motor Drift - 0 6B: Test Right Leg Motor Drift - 0 7: Test Limb Ataxia - 0 8: Test Sensation - 0 9: Test Language/Aphasia- 0 10: Test Dysarthria - 0 11: Test Extinction/Inattention - 0 NIHSS score: 1   Imaging Reviewed: CT head is negative  Labs reviewed in epic and pertinent values follow: CBG 133   Assessment: 51 year old male with progressive  aphasia and visual change with gradual resolution associated with retro-orbital headache.  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  If either has significant findings, then we will need to pursue this but if negative I would treat this as complicated migraine.  Recommendations: 1) MRI brain 2) CTA head and neck 3) if the above are negative, would treat as complicated migraine. 4) if either significant stenosis or MRI is positive then would admit for stroke work-up.  Ritta Slot, MD [facility] [phone]  If 7pm- 7am, please page neurology on call as listed in [facility].

## 2022-05-25 NOTE — Progress Notes (Signed)
   05/25/22 2000  Clinical Encounter Type  Visited With Patient and family together  Visit Type Initial;Spiritual support;Social support   Chaplain Sayuri Rhames returned to pt room and was able to engage pt and partner. Chaplain B offered non-anxious, compassionate presence as well as letting them know of presence earlier during initial care evaluation. This chaplain inquired whether they felt they had received answers to their questions, and they did. Chaplain B offered normalization of emotions, acknowledging that they may still be uncertain of having other reactions. This chaplain offered hospitality, prayer, and listening, also indicating that her pastoral care presence would remain available throughout the night.  Pt and partner indicated some feelings of relief since some symptoms already improving. Chaplain B offered words of support & encouragement and again encouraged them to let their nurse know if support might be helpful later on.

## 2022-05-25 NOTE — Discharge Instructions (Addendum)
Take the Imitrex at the first sign of a headache or vision change.  Take Excedrin Migraine for headache otherwise

## 2022-05-25 NOTE — ED Notes (Signed)
Code stroke called at 16:47

## 2022-05-25 NOTE — ED Triage Notes (Signed)
Patient brought in via ems from home. Patient states he was shopping with daughter when he states he had sudden onset of tunnel vision with eye pain. States he felt like he had to think about what point he was trying to get across. LKN 1750.

## 2022-05-25 NOTE — ED Notes (Signed)
Amada Jupiter, MD from neuro does not recommend TNK at this time

## 2022-05-25 NOTE — ED Provider Notes (Signed)
[facility] Provider Note    Event Date/Time   First MD Initiated Contact with Patient 05/25/22 1900     (approximate)   History   Code Stroke   HPI  Arthur Kent is a 51 y.o. male here with vision change and transient difficulty speaking.  The patient was in his usual state of health until just prior to arrival.  He reportedly began developing difficulty speaking and felt like he was having a hard time getting the words in the correct order.  He then had jagged lines in his vision followed by what he feels like was diminished vision.  Symptoms have largely resolved by the time my assessment.  He was activated as a code stroke.  No history of similar symptoms.  No history of TIA or CVA.  No recent trauma.  Denies any other major medical history.     Physical Exam   Triage Vital Signs: ED Triage Vitals  Enc Vitals Group     BP 05/25/22 1848 139/83     Pulse Rate 05/25/22 1848 (!) 103     Resp 05/25/22 1848 18     Temp 05/25/22 1848 98.3 F (36.8 C)     Temp Source 05/25/22 1848 Oral     SpO2 05/25/22 1848 94 %     Weight --      Height --      Head Circumference --      Peak Flow --      Pain Score 05/25/22 1900 3     Pain Loc --      Pain Edu? --      Excl. in GC? --     Most recent vital signs: Vitals:   05/25/22 2000 05/25/22 2130  BP: 119/75 122/89  Pulse: 81 79  Resp: (!) 24 16  Temp:    SpO2: 99% 98%     General: Awake, no distress.  CV:  Good peripheral perfusion.  Resp:  Normal effort.  Abd:  No distention.  Other:  Neurological Exam:  Mental Status: Alert and oriented to person, place, and time. Attention and concentration normal. Speech clear. Recent memory is intact. Cranial Nerves: Visual fields grossly intact. EOMI and PERRLA. No nystagmus noted. Facial sensation intact at forehead, maxillary [place], and chin/mandible bilaterally. No facial asymmetry or weakness. Hearing grossly normal. Uvula is midline, and palate  elevates symmetrically. Normal SCM and trapezius strength. Tongue midline without fasciculations. Motor: Muscle strength 5/5 in proximal and distal UE and LE bilaterally. No pronator drift. Muscle tone normal. Sensation: Intact to light touch in upper and lower extremities distally bilaterally.  Coordination: Normal FTN bilaterally.     ED Results / Procedures / Treatments   Labs (all labs ordered are listed, but only abnormal results are displayed) Labs Reviewed  COMPREHENSIVE METABOLIC PANEL - Abnormal; Notable for the following components:      Result Value   Glucose, Bld 120 (*)    Calcium 8.8 (*)    All other components within normal limits  CBG MONITORING, ED - Abnormal; Notable for the following components:   Glucose-Capillary 133 (*)    All other components within normal limits  PROTIME-INR  APTT  CBC  DIFFERENTIAL  ETHANOL     EKG Normal sinus rhythm, ventricular rate 90.  PR 168, QRS 87, QTc 413.  No acute ST elevations or depressions.  No EKG evidence of acute ischemia or infarct.   RADIOLOGY CT head: No acute abnormality CT angio:  Negative CTA of the head neck   I also independently reviewed and agree with radiologist interpretations.   PROCEDURES:  Critical Care performed: No   MEDICATIONS ORDERED IN ED: Medications  sodium chloride flush (NS) 0.9 % injection 3 mL (0 mLs Intravenous Hold 05/25/22 1925)  iohexol (OMNIPAQUE) 350 MG/ML injection 75 mL (75 mLs Intravenous Contrast Given 05/25/22 1938)  prochlorperazine (COMPAZINE) injection 10 mg (10 mg Intravenous Given 05/25/22 1953)  diphenhydrAMINE (BENADRYL) injection 25 mg (25 mg Intravenous Given 05/25/22 1953)  sodium chloride 0.9 % bolus 1,000 mL (1,000 mLs Intravenous New Bag/Given 05/25/22 1956)     IMPRESSION / MDM / ASSESSMENT AND PLAN / ED COURSE  I reviewed the triage vital signs and the nursing notes.                               The patient is on the cardiac monitor to evaluate for  evidence of arrhythmia and/or significant heart rate changes.   Ddx:  Differential includes the following, with pertinent life- or limb-threatening emergencies considered:  CVA, complex migraine, TIA, hypoperfusion with transient hypotension, anemia, arrhythmia  Patient's presentation is most consistent with acute presentation with potential threat to life or bodily function.  MDM:  51 year old male with no significant past medical history here with transient vision changes as well as speech difficulty.  Patient activated as a code stroke on arrival.  He has had essentially full resolution of his symptoms on my assessment.  Lab work very reassuring.  No leukocytosis.  CMP pending.  CT of the head and neck shows no acute or large vessel occlusion.  He has no hemodynamically significant or correctable stenosis.  Dr. 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  MRI Negative. CT angio negative. Pt symptoms remain resolved. He is HDS.   Will tx with imitrex for migraines as he would like something that he can take and drive if HA resolves, advised supportive care otherwise and good return precautions. He is aware he should return to ER with any new or different neuro sx.  MEDICATIONS GIVEN IN ED: Medications  sodium chloride flush (NS) 0.9 % injection 3 mL (0 mLs Intravenous Hold 05/25/22 1925)  iohexol (OMNIPAQUE) 350 MG/ML injection 75 mL (75 mLs Intravenous Contrast Given 05/25/22 1938)  prochlorperazine (COMPAZINE) injection 10 mg (10 mg Intravenous Given 05/25/22 1953)  diphenhydrAMINE (BENADRYL) injection 25 mg (25 mg Intravenous Given 05/25/22 1953)  sodium chloride 0.9 % bolus 1,000 mL (1,000 mLs Intravenous New Bag/Given 05/25/22 1956)     Consults:  Neurology Dr. Amada Jupiter   EMR reviewed       FINAL CLINICAL IMPRESSION(S) /  ED DIAGNOSES   Final diagnoses:  Atypical migraine     Rx / DC Orders   ED Discharge Orders          Ordered    SUMAtriptan (IMITREX) 50 MG tablet  Once PRN        05/25/22 2202             Note:  This document was prepared using Dragon voice recognition software and may include unintentional dictation errors.   Shaune Pollack, MD 05/25/22 2204

## 2022-05-25 NOTE — Progress Notes (Signed)
   05/25/22 1910  Clinical Encounter Type  Visited With Patient not available   Chaplain Loreto Loescher responded to code stroke. Upon arrival pt receiving evaluation by physician. This chaplain remained on standby outside room. Chaplain B offered silent prayer and waited until needs could be assessed.  Eventually left while care was ongoing with plan to f/u.

## 2022-05-25 NOTE — Progress Notes (Signed)
Code Stroke activated @ 1851.  Pt in CT at time of activation.  LKWT 1730.  mRS 0.  Dr. Amada Jupiter on camera @ [id number].  NIHSS 1.  No TNK per Dr. Amada Jupiter.  Sent back for advanced imaging.

## 2022-09-15 DIAGNOSIS — R42 Dizziness and giddiness: Secondary | ICD-10-CM | POA: Diagnosis not present

## 2022-09-15 DIAGNOSIS — H6592 Unspecified nonsuppurative otitis media, left ear: Secondary | ICD-10-CM | POA: Diagnosis not present

## 2022-10-14 NOTE — Progress Notes (Unsigned)
There were no vitals taken for this visit.   Subjective:    Patient ID: Arthur Kent, male    DOB: 1971-03-21, 52 y.o.   MRN: [id number]  HPI: Arthur Kent is a 52 y.o. male  No chief complaint on file.  Patient presents to clinic to establish care with new PCP.  Introduced to Designer, jewellery role and practice setting.  All questions answered.  Discussed provider/patient relationship and expectations.  Patient reports a history of ***. Patient denies a history of: Hypertension, Elevated Cholesterol, Diabetes, Thyroid problems, Depression, Anxiety, Neurological problems, and Abdominal problems.   Active Ambulatory Problems    Diagnosis Date Noted   No Active Ambulatory Problems   Resolved Ambulatory Problems    Diagnosis Date Noted   No Resolved Ambulatory Problems   Past Medical History:  Diagnosis Date   Kidney stone    Past Surgical History:  Procedure Laterality Date   FACIAL COSMETIC SURGERY     No family history on file.   Review of Systems  Per HPI unless specifically indicated above     Objective:    There were no vitals taken for this visit.  Wt Readings from Last 3 Encounters:  11/23/19 175 lb (79.4 kg)  05/27/18 180 lb (81.6 kg)  11/24/17 170 lb (77.1 kg)    Physical Exam  Results for orders placed or performed during the hospital encounter of 05/25/22  Protime-INR  Result Value Ref Range   Prothrombin Time 13.3 11.4 - 15.2 seconds   INR 1.0 0.8 - 1.2  APTT  Result Value Ref Range   aPTT 27 24 - 36 seconds  CBC  Result Value Ref Range   WBC 7.0 4.0 - 10.5 K/uL   RBC 4.93 4.22 - 5.81 MIL/uL   Hemoglobin 15.2 13.0 - 17.0 g/dL   HCT 44.4 39.0 - 52.0 %   MCV 90.1 80.0 - 100.0 fL   MCH 30.8 26.0 - 34.0 pg   MCHC 34.2 30.0 - 36.0 g/dL   RDW 11.9 11.5 - 15.5 %   Platelets 214 150 - 400 K/uL   nRBC 0.0 0.0 - 0.2 %  Differential  Result Value Ref Range   Neutrophils Relative % 59 %   Neutro Abs 4.1 1.7 - 7.7 K/uL    Lymphocytes Relative 27 %   Lymphs Abs 1.9 0.7 - 4.0 K/uL   Monocytes Relative 6 %   Monocytes Absolute 0.5 0.1 - 1.0 K/uL   Eosinophils Relative 7 %   Eosinophils Absolute 0.5 0.0 - 0.5 K/uL   Basophils Relative 1 %   Basophils Absolute 0.1 0.0 - 0.1 K/uL   Immature Granulocytes 0 %   Abs Immature Granulocytes 0.02 0.00 - 0.07 K/uL  Comprehensive metabolic panel  Result Value Ref Range   Sodium 140 135 - 145 mmol/L   Potassium 3.9 3.5 - 5.1 mmol/L   Chloride 109 98 - 111 mmol/L   CO2 25 22 - 32 mmol/L   Glucose, Bld 120 (H) 70 - 99 mg/dL   BUN 15 6 - 20 mg/dL   Creatinine, Ser 1.05 0.61 - 1.24 mg/dL   Calcium 8.8 (L) 8.9 - 10.3 mg/dL   Total Protein 6.8 6.5 - 8.1 g/dL   Albumin 4.2 3.5 - 5.0 g/dL   AST 24 15 - 41 U/L   ALT 29 0 - 44 U/L   Alkaline Phosphatase 73 38 - 126 U/L   Total Bilirubin 0.5 0.3 - 1.2 mg/dL   GFR,  Estimated >60 >60 mL/min   Anion gap 6 5 - 15  Ethanol  Result Value Ref Range   Alcohol, Ethyl (B) <10 <10 mg/dL  CBG monitoring, ED  Result Value Ref Range   Glucose-Capillary 133 (H) 70 - 99 mg/dL      Assessment & Plan:   Problem List Items Addressed This Visit   None    Follow up plan: No follow-ups on file.

## 2022-10-15 ENCOUNTER — Ambulatory Visit: Payer: BC Managed Care – PPO | Admitting: Nurse Practitioner

## 2022-10-15 ENCOUNTER — Encounter: Payer: Self-pay | Admitting: Nurse Practitioner

## 2022-10-15 VITALS — BP 107/68 | HR 90 | Temp 97.6°F | Ht 71.7 in | Wt 179.4 lb

## 2022-10-15 DIAGNOSIS — Z114 Encounter for screening for human immunodeficiency virus [HIV]: Secondary | ICD-10-CM

## 2022-10-15 DIAGNOSIS — Z136 Encounter for screening for cardiovascular disorders: Secondary | ICD-10-CM | POA: Diagnosis not present

## 2022-10-15 DIAGNOSIS — Z7689 Persons encountering health services in other specified circumstances: Secondary | ICD-10-CM

## 2022-10-15 DIAGNOSIS — Z720 Tobacco use: Secondary | ICD-10-CM

## 2022-10-15 DIAGNOSIS — Z0000 Encounter for general adult medical examination without abnormal findings: Secondary | ICD-10-CM | POA: Diagnosis not present

## 2022-10-15 DIAGNOSIS — Z1159 Encounter for screening for other viral diseases: Secondary | ICD-10-CM | POA: Diagnosis not present

## 2022-10-15 DIAGNOSIS — Z23 Encounter for immunization: Secondary | ICD-10-CM

## 2022-10-15 DIAGNOSIS — Z1211 Encounter for screening for malignant neoplasm of colon: Secondary | ICD-10-CM

## 2022-10-15 LAB — URINALYSIS, ROUTINE W REFLEX MICROSCOPIC
Bilirubin, UA: NEGATIVE
Glucose, UA: NEGATIVE
Ketones, UA: NEGATIVE
Leukocytes,UA: NEGATIVE
Nitrite, UA: NEGATIVE
Protein,UA: NEGATIVE
RBC, UA: NEGATIVE
Specific Gravity, UA: >1.03 — ABNORMAL HIGH (ref 1.005–1.030)
Urobilinogen, Ur: 0.2 mg/dL (ref 0.2–1.0)
pH, UA: 5 (ref 5.0–7.5)

## 2022-10-15 NOTE — Progress Notes (Signed)
BP 107/68   Pulse 90   Temp 97.6 F (36.4 C) (Oral)   Ht 5' 11.7" (1.821 m)   Wt 179 lb 6.4 oz (81.4 kg)   SpO2 94%   BMI 24.54 kg/m    Subjective:    Patient ID: Arthur Kent, male    DOB: [birth year], 52 y.o.   MRN: [id number]  HPI: Arthur Kent is a 52 y.o. male presenting on 10/15/2022 for comprehensive medical examination. Current medical complaints include: Dizziness  He currently lives with: Interim Problems from his last visit: no  Patient presents to clinic to establish care with new PCP.  Introduced to Designer, jewellery role and practice setting.  All questions answered.  Discussed provider/patient relationship and expectations.  Patient reports a history of Tobacco use.  He has been having dizziness due to middle ear effusion.    Patient denies a history of: Hypertension, Elevated Cholesterol, Diabetes, Thyroid problems, Depression, Anxiety, Neurological problems, and Abdominal problems.    Denies HA, CP, SOB, palpitations, visual changes, and lower extremity swelling.  Active Ambulatory Problems    Diagnosis Date Noted   No Active Ambulatory Problems   Resolved Ambulatory Problems    Diagnosis Date Noted   No Resolved Ambulatory Problems   Past Medical History:  Diagnosis Date   Kidney stone    Past Surgical History:  Procedure Laterality Date   FACIAL COSMETIC SURGERY     Family History  Problem Relation Age of Onset   Rectal cancer Sister    Lung cancer Maternal Grandmother    Stroke Maternal Grandfather    Lung cancer Paternal Grandmother    Stroke Paternal Grandfather     Depression Screen done today and results listed below:     10/15/2022    1:31 PM  Depression screen PHQ 2/9  Decreased Interest 0  Down, Depressed, Hopeless 0  PHQ - 2 Score 0  Altered sleeping 0  Tired, decreased energy 0  Change in appetite 0  Feeling bad or failure about yourself  0  Trouble concentrating 0  Moving slowly or fidgety/restless 0   Suicidal thoughts 0  PHQ-9 Score 0  Difficult doing work/chores Not difficult at all    The patient does not have a history of falls. I did complete a risk assessment for falls. A plan of care for falls was documented.   Past Medical History:  Past Medical History:  Diagnosis Date   Kidney stone     Surgical History:  Past Surgical History:  Procedure Laterality Date   FACIAL COSMETIC SURGERY      Medications:  Current Outpatient Medications on File Prior to Visit  Medication Sig   pseudoephedrine (SUDAFED) 120 MG 12 hr tablet Take 120 mg by mouth 2 (two) times daily.   No current facility-administered medications on file prior to visit.    Allergies:  Allergies  Allergen Reactions   Codeine    Oxycodone Itching   Percocet [Oxycodone-Acetaminophen] Itching    Social History:  Social History   Socioeconomic History   Marital status: Single    Spouse name: Not on file   Number of children: Not on file   Years of education: Not on file   Highest education level: Not on file  Occupational History   Not on file  Tobacco Use   Smoking status: Every Day    Packs/day: 0.25    Types: Cigarettes   Smokeless tobacco: Never  Vaping Use   Vaping Use: Never used  Substance and Sexual Activity   Alcohol use: Yes    Comment: occasionally   Drug use: Never   Sexual activity: Yes  Other Topics Concern   Not on file  Social History Narrative   Not on file   Social Determinants of Health   Financial Resource Strain: Not on file  Food Insecurity: Not on file  Transportation Needs: Not on file  Physical Activity: Not on file  Stress: Not on file  Social Connections: Not on file  Intimate Partner Violence: Not on file   Social History   Tobacco Use  Smoking Status Every Day   Packs/day: 0.25   Types: Cigarettes  Smokeless Tobacco Never   Social History   Substance and Sexual Activity  Alcohol Use Yes   Comment: occasionally    Family History:   Family History  Problem Relation Age of Onset   Rectal cancer Sister    Lung cancer Maternal Grandmother    Stroke Maternal Grandfather    Lung cancer Paternal Grandmother    Stroke Paternal Grandfather     Past medical history, surgical history, medications, allergies, family history and social history reviewed with patient today and changes made to appropriate areas of the chart.   Review of Systems  Eyes:  Negative for blurred vision and double vision.  Respiratory:  Negative for shortness of breath.   Cardiovascular:  Negative for chest pain, palpitations and leg swelling.  Neurological:  Positive for dizziness. Negative for headaches.   All other ROS negative except what is listed above and in the HPI.      Objective:    BP 107/68   Pulse 90   Temp 97.6 F (36.4 C) (Oral)   Ht 5' 11.7" (1.821 m)   Wt 179 lb 6.4 oz (81.4 kg)   SpO2 94%   BMI 24.54 kg/m   Wt Readings from Last 3 Encounters:  10/15/22 179 lb 6.4 oz (81.4 kg)  11/23/19 175 lb (79.4 kg)  05/27/18 180 lb (81.6 kg)    Physical Exam Vitals and nursing note reviewed.  Constitutional:      General: He is not in acute distress.    Appearance: Normal appearance. He is not ill-appearing, toxic-appearing or diaphoretic.  HENT:     Head: Normocephalic.     Right Ear: Tympanic membrane, ear canal and external ear normal.     Left Ear: Tympanic membrane, ear canal and external ear normal.     Nose: Nose normal. No congestion or rhinorrhea.     Mouth/Throat:     Mouth: Mucous membranes are moist.  Eyes:     General:        Right eye: No discharge.        Left eye: No discharge.     Extraocular Movements: Extraocular movements intact.     Conjunctiva/sclera: Conjunctivae normal.     Pupils: Pupils are equal, round, and reactive to light.  Cardiovascular:     Rate and Rhythm: Normal rate and regular rhythm.     Heart sounds: No murmur heard. Pulmonary:     Effort: Pulmonary effort is normal. No  respiratory distress.     Breath sounds: Normal breath sounds. No wheezing, rhonchi or rales.  Abdominal:     General: Abdomen is flat. Bowel sounds are normal. There is no distension.     Palpations: Abdomen is soft.     Tenderness: There is no abdominal tenderness. There is no guarding.  Musculoskeletal:     Cervical back:  Normal range of motion and neck supple.  Skin:    General: Skin is warm and dry.     Capillary Refill: Capillary refill takes less than 2 seconds.  Neurological:     General: No focal deficit present.     Mental Status: He is alert and oriented to person, place, and time.     Cranial Nerves: No cranial nerve deficit.     Motor: No weakness.     Deep Tendon Reflexes: Reflexes normal.  Psychiatric:        Mood and Affect: Mood normal.        Behavior: Behavior normal.        Thought Content: Thought content normal.        Judgment: Judgment normal.     Results for orders placed or performed during the hospital encounter of 05/25/22  Protime-INR  Result Value Ref Range   Prothrombin Time 13.3 11.4 - 15.2 seconds   INR 1.0 0.8 - 1.2  APTT  Result Value Ref Range   aPTT 27 24 - 36 seconds  CBC  Result Value Ref Range   WBC 7.0 4.0 - 10.5 K/uL   RBC 4.93 4.22 - 5.81 MIL/uL   Hemoglobin 15.2 13.0 - 17.0 g/dL   HCT 73.4 19.3 - 79.0 %   MCV 90.1 80.0 - 100.0 fL   MCH 30.8 26.0 - 34.0 pg   MCHC 34.2 30.0 - 36.0 g/dL   RDW 24.0 97.3 - 53.2 %   Platelets 214 150 - 400 K/uL   nRBC 0.0 0.0 - 0.2 %  Differential  Result Value Ref Range   Neutrophils Relative % 59 %   Neutro Abs 4.1 1.7 - 7.7 K/uL   Lymphocytes Relative 27 %   Lymphs Abs 1.9 0.7 - 4.0 K/uL   Monocytes Relative 6 %   Monocytes Absolute 0.5 0.1 - 1.0 K/uL   Eosinophils Relative 7 %   Eosinophils Absolute 0.5 0.0 - 0.5 K/uL   Basophils Relative 1 %   Basophils Absolute 0.1 0.0 - 0.1 K/uL   Immature Granulocytes 0 %   Abs Immature Granulocytes 0.02 0.00 - 0.07 K/uL  Comprehensive metabolic  panel  Result Value Ref Range   Sodium 140 135 - 145 mmol/L   Potassium 3.9 3.5 - 5.1 mmol/L   Chloride 109 98 - 111 mmol/L   CO2 25 22 - 32 mmol/L   Glucose, Bld 120 (H) 70 - 99 mg/dL   BUN 15 6 - 20 mg/dL   Creatinine, Ser 9.92 0.61 - 1.24 mg/dL   Calcium 8.8 (L) 8.9 - 10.3 mg/dL   Total Protein 6.8 6.5 - 8.1 g/dL   Albumin 4.2 3.5 - 5.0 g/dL   AST 24 15 - 41 U/L   ALT 29 0 - 44 U/L   Alkaline Phosphatase 73 38 - 126 U/L   Total Bilirubin 0.5 0.3 - 1.2 mg/dL   GFR, Estimated >42 >68 mL/min   Anion gap 6 5 - 15  Ethanol  Result Value Ref Range   Alcohol, Ethyl (B) <10 <10 mg/dL  CBG monitoring, ED  Result Value Ref Range   Glucose-Capillary 133 (H) 70 - 99 mg/dL      Assessment & Plan:   Problem List Items Addressed This Visit   None Visit Diagnoses     Annual physical exam    -  Primary   Health maintenance reviewed during visit today.  Labs ordered. TDAP given. Referral placed for colonoscopy.  Relevant Orders   TSH   PSA   Lipid panel   CBC with Differential/Platelet   Comprehensive metabolic panel   Urinalysis, Routine w reflex microscopic   HIV Antibody (routine testing w rflx)   Hepatitis C Antibody   Tobacco use       Screening for ischemic heart disease       Relevant Orders   Lipid panel   Encounter to establish care       Screening for HIV (human immunodeficiency virus)       Relevant Orders   HIV Antibody (routine testing w rflx)   Encounter for hepatitis C screening test for low risk patient       Relevant Orders   Hepatitis C Antibody   Screening for colon cancer       Relevant Orders   Ambulatory referral to Gastroenterology   Need for tetanus booster       Relevant Orders   Tdap vaccine greater than or equal to 7yo IM        Discussed aspirin prophylaxis for myocardial infarction prevention and decision was it was not indicated  LABORATORY TESTING:  Health maintenance labs ordered today as discussed above.   The natural history of  prostate cancer and ongoing controversy regarding screening and potential treatment outcomes of prostate cancer has been discussed with the patient. The meaning of a false positive PSA and a false negative PSA has been discussed. He indicates understanding of the limitations of this screening test and wishes to proceed with screening PSA testing.   IMMUNIZATIONS:   - Tdap: Tetanus vaccination status reviewed: last tetanus booster within 10 years. - Influenza: Refused - Pneumovax: Not applicable - Prevnar: Not applicable - COVID: Not applicable - HPV: Not applicable - Shingrix vaccine: Not applicable  SCREENING: - Colonoscopy: Ordered today  Discussed with patient purpose of the colonoscopy is to detect colon cancer at curable precancerous or early stages   - AAA Screening: Not applicable  -Hearing Test: Not applicable  -Spirometry: Not applicable   PATIENT COUNSELING:    Sexuality: Discussed sexually transmitted diseases, partner selection, use of condoms, avoidance of unintended pregnancy  and contraceptive alternatives.   Advised to avoid cigarette smoking.  I discussed with the patient that most people either abstain from alcohol or drink within safe limits (<=14/week and <=4 drinks/occasion for males, <=7/weeks and <= 3 drinks/occasion for females) and that the risk for alcohol disorders and other health effects rises proportionally with the number of drinks per week and how often a drinker exceeds daily limits.  Discussed cessation/primary prevention of drug use and availability of treatment for abuse.   Diet: Encouraged to adjust caloric intake to maintain  or achieve ideal body weight, to reduce intake of dietary saturated fat and total fat, to limit sodium intake by avoiding high sodium foods and not adding table salt, and to maintain adequate dietary potassium and calcium preferably from fresh fruits, vegetables, and low-fat dairy products.    stressed the importance of  regular exercise  Injury prevention: Discussed safety belts, safety helmets, smoke detector, smoking near bedding or upholstery.   Dental health: Discussed importance of regular tooth brushing, flossing, and dental visits.   Follow up plan: NEXT PREVENTATIVE PHYSICAL DUE IN 1 YEAR. Return in about 1 year (around 10/16/2023) for Physical and Fasting labs.

## 2022-10-16 ENCOUNTER — Other Ambulatory Visit: Payer: Self-pay

## 2022-10-16 ENCOUNTER — Telehealth: Payer: Self-pay

## 2022-10-16 DIAGNOSIS — Z1211 Encounter for screening for malignant neoplasm of colon: Secondary | ICD-10-CM

## 2022-10-16 LAB — HIV ANTIBODY (ROUTINE TESTING W REFLEX): HIV Screen 4th Generation wRfx: NONREACTIVE

## 2022-10-16 LAB — CBC WITH DIFFERENTIAL/PLATELET
Basophils Absolute: 0.1 10*3/uL (ref 0.0–0.2)
Basos: 1 %
EOS (ABSOLUTE): 0.6 10*3/uL — ABNORMAL HIGH (ref 0.0–0.4)
Eos: 9 %
Hematocrit: 47.8 % (ref 37.5–51.0)
Hemoglobin: 16.5 g/dL (ref 13.0–17.7)
Immature Grans (Abs): 0 10*3/uL (ref 0.0–0.1)
Immature Granulocytes: 0 %
Lymphocytes Absolute: 1.7 10*3/uL (ref 0.7–3.1)
Lymphs: 26 %
MCH: 30.7 pg (ref 26.6–33.0)
MCHC: 34.5 g/dL (ref 31.5–35.7)
MCV: 89 fL (ref 79–97)
Monocytes Absolute: 0.5 10*3/uL (ref 0.1–0.9)
Monocytes: 8 %
Neutrophils Absolute: 3.7 10*3/uL (ref 1.4–7.0)
Neutrophils: 56 %
Platelets: 277 10*3/uL (ref 150–450)
RBC: 5.37 x10E6/uL (ref 4.14–5.80)
RDW: 12.2 % (ref 11.6–15.4)
WBC: 6.7 10*3/uL (ref 3.4–10.8)

## 2022-10-16 LAB — HEPATITIS C ANTIBODY: Hep C Virus Ab: NONREACTIVE

## 2022-10-16 LAB — COMPREHENSIVE METABOLIC PANEL
ALT: 33 IU/L (ref 0–44)
AST: 18 IU/L (ref 0–40)
Albumin/Globulin Ratio: 2.1 (ref 1.2–2.2)
Albumin: 4.6 g/dL (ref 4.1–5.1)
Alkaline Phosphatase: 95 IU/L (ref 44–121)
BUN/Creatinine Ratio: 13 (ref 9–20)
BUN: 13 mg/dL (ref 6–24)
Bilirubin Total: 0.5 mg/dL (ref 0.0–1.2)
CO2: 26 mmol/L (ref 20–29)
Calcium: 10.1 mg/dL (ref 8.7–10.2)
Chloride: 105 mmol/L (ref 96–106)
Creatinine, Ser: 1.01 mg/dL (ref 0.76–1.27)
Globulin, Total: 2.2 g/dL (ref 1.5–4.5)
Glucose: 79 mg/dL (ref 70–99)
Potassium: 3.9 mmol/L (ref 3.5–5.2)
Sodium: 143 mmol/L (ref 134–144)
Total Protein: 6.8 g/dL (ref 6.0–8.5)
eGFR: 91 mL/min/{1.73_m2} (ref >59)

## 2022-10-16 LAB — LIPID PANEL
Chol/HDL Ratio: 4.7 ratio (ref 0.0–5.0)
Cholesterol, Total: 210 mg/dL — ABNORMAL HIGH (ref 100–199)
HDL: 45 mg/dL (ref >39)
LDL Chol Calc (NIH): 136 mg/dL — ABNORMAL HIGH (ref 0–99)
Triglycerides: 164 mg/dL — ABNORMAL HIGH (ref 0–149)
VLDL Cholesterol Cal: 29 mg/dL (ref 5–40)

## 2022-10-16 LAB — TSH: TSH: 1.53 u[IU]/mL (ref 0.450–4.500)

## 2022-10-16 LAB — PSA: Prostate Specific Ag, Serum: 1.9 ng/mL (ref 0.0–4.0)

## 2022-10-16 MED ORDER — NA SULFATE-K SULFATE-MG SULF 17.5-3.13-1.6 GM/177ML PO SOLN: 1.0000 | Freq: Once | ORAL | 0 refills | Status: AC

## 2022-10-16 NOTE — Progress Notes (Signed)
Please let patient know that his cholesterol is elevated.  His cardiac risk score puts him at moderate risk of having a stroke or heart attack over the next 10 years.  I recommend that he start a statin called crestor 5mg  daily.  The goal will be to increase this to 20mg  daily if patient tolerates it well.  If he agrees to the medication I can send it to the pharmacy.  If he agrees, we should follow up in 6 months to repeat the labs.  Otherwise, his lab work looks great.  No concerns at this time.  The 10-year ASCVD risk score (Arnett DK, et al., 2019) is: 6.3%   Values used to calculate the score:     Age: 52 years     Sex: Male     Is Non-Hispanic African American: No     Diabetic: No     Tobacco smoker: Yes     Systolic Blood Pressure: 532 mmHg     Is BP treated: No     HDL Cholesterol: 45 mg/dL     Total Cholesterol: 210 mg/dL

## 2022-10-16 NOTE — Telephone Encounter (Signed)
Gastroenterology Pre-Procedure Review  Request Date: 12/01/22 Requesting Physician: Dr. Allen Norris  PATIENT REVIEW QUESTIONS: The patient responded to the following health history questions as indicated:    1. Are you having any GI issues? no 2. Do you have a personal history of Polyps? no 3. Do you have a family history of Colon Cancer or Polyps? no 4. Diabetes Mellitus? no 5. Joint replacements in the past 12 months?no 6. Major health problems in the past 3 months?no 7. Any artificial heart valves, MVP, or defibrillator?no    MEDICATIONS & ALLERGIES:    Patient reports the following regarding taking any anticoagulation/antiplatelet therapy:   Plavix, Coumadin, Eliquis, Xarelto, Lovenox, Pradaxa, Brilinta, or Effient? no Aspirin? no  Patient confirms/reports the following medications:  Current Outpatient Medications  Medication Sig Dispense Refill   pseudoephedrine (SUDAFED) 120 MG 12 hr tablet Take 120 mg by mouth 2 (two) times daily.     No current facility-administered medications for this visit.    Patient confirms/reports the following allergies:  Allergies  Allergen Reactions   Codeine    Oxycodone Itching   Percocet [Oxycodone-Acetaminophen] Itching    No orders of the defined types were placed in this encounter.   AUTHORIZATION INFORMATION Primary Insurance: 1D#: Group #:  Secondary Insurance: 1D#: Group #:  SCHEDULE INFORMATION: Date: 12/01/22 Time: Location: [place]

## 2022-12-01 ENCOUNTER — Ambulatory Visit: Admit: 2022-12-01 | Payer: BC Managed Care – PPO | Admitting: Gastroenterology

## 2022-12-01 SURGERY — COLONOSCOPY WITH PROPOFOL
Anesthesia: Choice

## 2023-01-09 DIAGNOSIS — J069 Acute upper respiratory infection, unspecified: Secondary | ICD-10-CM | POA: Diagnosis not present

## 2023-01-09 DIAGNOSIS — Z20822 Contact with and (suspected) exposure to covid-19: Secondary | ICD-10-CM | POA: Diagnosis not present

## 2023-01-09 DIAGNOSIS — R509 Fever, unspecified: Secondary | ICD-10-CM | POA: Diagnosis not present

## 2023-01-22 ENCOUNTER — Inpatient Hospital Stay
Admit: 2023-01-22 | Discharge: 2023-01-23 | Disposition: A | Discharge status: Home / Self Care | Payer: BLUE CROSS/BLUE SHIELD | Attending: Emergency Medicine

## 2023-01-22 ENCOUNTER — Emergency Department: Admit: 2023-01-23 | Payer: BLUE CROSS/BLUE SHIELD

## 2023-01-22 DIAGNOSIS — N132 Hydronephrosis with renal and ureteral calculous obstruction: Secondary | ICD-10-CM

## 2023-01-22 DIAGNOSIS — N200 Calculus of kidney: Secondary | ICD-10-CM

## 2023-01-22 MED ORDER — SODIUM CHLORIDE 0.9 % IV BOLUS
0.9 | Freq: Once | INTRAVENOUS | Status: AC
Start: 2023-01-22 — End: 2023-01-22
  Administered 2023-01-23: 1000 mL via INTRAVENOUS

## 2023-01-22 MED ORDER — HYDROMORPHONE HCL 1 MG/ML IJ SOLN
1 | INTRAMUSCULAR | Status: AC
Start: 2023-01-22 — End: 2023-01-22
  Administered 2023-01-23: 1 mg via INTRAVENOUS

## 2023-01-22 MED ORDER — ONDANSETRON HCL 4 MG/2ML IJ SOLN
4 | Freq: Once | INTRAMUSCULAR | Status: AC
Start: 2023-01-22 — End: 2023-01-22
  Administered 2023-01-23: 4 mg via INTRAVENOUS

## 2023-01-22 MED FILL — SODIUM CHLORIDE 0.9 % IV SOLN: 0.9 % | INTRAVENOUS | Qty: 1000

## 2023-01-22 NOTE — Discharge Instructions (Addendum)
Liberal fluids.  Motrin or Tylenol as directed for discomfort.  Follow-up with [place] [facility] in a.m ( call at [phone] )or See your urologist [facility] urologist in [place]) in 24 to 48 hours.  If symptoms get worse return to ED immediately.

## 2023-01-22 NOTE — ED Provider Notes (Signed)
[facility] EMERGENCY DEPT  EMERGENCY DEPARTMENT ENCOUNTER      Pt Name: Alan Gomez  MRN: [id number]  Birthdate 03-05-1971  Date of evaluation: 01/22/2023  Provider: Matthias Hughs, MD    CHIEF COMPLAINT       Chief Complaint   Patient presents with    Abdominal Pain     RLQ         HISTORY OF PRESENT ILLNESS   (Location/Symptom, Timing/Onset, Context/Setting, Quality, Duration, Modifying Factors, Severity)  Note limiting factors.   Alan Gomez is a 52 y.o. 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  He passed last year.  With nausea and vomiting.  No fevers or chills no recent trauma    Nursing Notes were reviewed.    REVIEW OF SYSTEMS    (2-9 systems for level 4, 10 or more for level 5)     Review of Systems   Constitutional: Negative.    HENT: Negative.     Eyes: Negative.    Respiratory: Negative.     Cardiovascular: Negative.    Gastrointestinal:  Positive for abdominal pain, nausea and vomiting.   Endocrine: Negative.    Genitourinary:  Positive for flank pain.   Musculoskeletal:  Positive for back pain.   Skin: Negative.    Allergic/Immunologic: Negative.    Neurological: Negative.    Hematological: Negative.    Psychiatric/Behavioral: Negative.     All other systems reviewed and are negative.      Except as noted above the remainder of the review of systems was reviewed and negative.       PAST MEDICAL HISTORY     Past Medical History:   Diagnosis Date    Renal calculi          SURGICAL HISTORY     History reviewed. No pertinent surgical history.      CURRENT MEDICATIONS       Previous Medications    No medications on file       ALLERGIES     Percocet [oxycodone-acetaminophen]    FAMILY HISTORY     History reviewed. No pertinent family history.       SOCIAL HISTORY       Social History      Socioeconomic History    Marital status: Unknown     Spouse name: None    Number of children: None    Years of education: None    Highest education level: None       SCREENINGS         Glasgow Coma Scale  Eye Opening: Spontaneous  Best Verbal Response: Oriented  Best Motor Response: Obeys commands  Glasgow Coma Scale Score: 15                     CIWA Assessment  BP: (!) 152/139  Pulse: (!) 114                 PHYSICAL EXAM    (up to 7 for level 4, 8 or more for level 5)     ED Triage Vitals [01/22/23 2000]   BP Temp Temp src Pulse Respirations SpO2 Height Weight - Scale   (!) 152/139 98.1 F (36.7 C) -- (!) 114 19 97 %  1.778 m ( ) 79.4 kg (175 lb)       Physical Exam  Vitals and nursing note reviewed.   Constitutional:       Appearance: Normal appearance. He is normal weight.   HENT:      Head: Normocephalic.      Right Ear: Tympanic membrane normal.      Left Ear: Tympanic membrane normal.      Nose: Nose normal.      Mouth/Throat:      Mouth: Mucous membranes are moist.   Eyes:      Extraocular Movements: Extraocular movements intact.      Pupils: Pupils are equal, round, and reactive to light.   Cardiovascular:      Rate and Rhythm: Normal rate and regular rhythm.      Pulses: Normal pulses.      Heart sounds: Normal heart sounds.   Pulmonary:      Effort: Pulmonary effort is normal.   Abdominal:      Syliva Mee: Abdomen is flat. Bowel sounds are normal.      Palpations: Abdomen is soft.      Tenderness: There is abdominal tenderness in the right lower quadrant. There is no right CVA tenderness, left CVA tenderness, guarding or rebound. Negative signs include Murphy's sign, Rovsing's sign, McBurney's sign, psoas sign and obturator sign.   Musculoskeletal:         Kermitt Harjo: Normal range of motion.      Cervical back: Normal range of motion and neck supple.   Skin:     Kinnley Paulson: Skin is warm.      Capillary Refill: Capillary refill takes less than 2 seconds.   Neurological:      Avi Archuleta: No focal deficit  present.      Mental Status: He is alert and oriented to person, place, and time.   Psychiatric:         Mood and Affect: Mood normal.         DIAGNOSTIC RESULTS     EKG: All EKG's are interpreted by the Emergency Department Physician who either signs or Co-signs this chart in the absence of a cardiologist.        RADIOLOGY:   Non-plain film images such as CT, Ultrasound and MRI are read by the radiologist. Plain radiographic images are visualized and preliminarily interpreted by the emergency physician with the below findings:        Interpretation per the Radiologist below, if available at the time of this note:    CT ABDOMEN PELVIS WO CONTRAST Additional Contrast? Radiologist Recommendation   Final Result      1. A 2 mm obstructing stone in the distal right ureter with mild right   hydroureteronephrosis.   2. Additional few punctate nonobstructing stones in the right kidney.               ED BEDSIDE ULTRASOUND:   Performed by ED Physician - none    LABS:  Labs Reviewed   CBC WITH AUTO DIFFERENTIAL - Abnormal; Notable for the following components:       Result Value    Eosinophils Absolute 0.5 (*)     All other components within normal limits   COMPREHENSIVE METABOLIC PANEL - Abnormal; Notable for the following components:    Potassium 3.4 (*)     Glucose 126 (*)     Creatinine 1.35 (*)     All other components within normal limits   URINALYSIS - Abnormal; Notable for  the following components:    Specific Gravity, UA >1.030 (*)     Protein, UA 100 (*)     Ketones, Urine Trace (*)     Blood, Urine Large (*)     Urobilinogen, Urine 2.0 (*)     All other components within normal limits   URINALYSIS, MICRO - Abnormal; Notable for the following components:    RBC, UA >100 (*)     All other components within normal limits   LIPASE       All other labs were within normal range or not returned as of this dictation.    EMERGENCY DEPARTMENT COURSE and DIFFERENTIAL DIAGNOSIS/MDM:   Vitals:    Vitals:    01/22/23 2000   BP:  (!) 152/139   Pulse: (!) 114   Resp: 19   Temp: 98.1 F (36.7 C)   SpO2: 97%   Weight: 79.4 kg (175 lb)   Height: 1.778 m (5\' 10" )             Medical Decision Making  Amount and/or Complexity of Data Reviewed  Labs: ordered.  Radiology: ordered.    Risk  Prescription drug management.    DDX: kidney stones, strain, sprain back, UTI, constipation, electrolyst imbalance        REASSESSMENT      Patient was reassessed after receiving Dilaudid 1 mg IV Toradol IV 30 mg and normal saline a liter to 1 L.  He stated he is feeling much better and ready to leave.  I discussed the findings with him and the need.  Follow-up with urologist tomorrow.    CRITICAL CARE TIME   Total Critical Care time was  minutes, excluding separately reportable procedures.  There was a high probability of clinically significant/life threatening deterioration in the patient's condition which required my urgent intervention.      CONSULTS:  None    PROCEDURES:  Unless otherwise noted below, none     Procedures      FINAL IMPRESSION    No diagnosis found.      DISPOSITION/PLAN   DISPOSITION        PATIENT REFERRED TO:  No follow-up provider specified.    DISCHARGE MEDICATIONS:  New Prescriptions    No medications on file     Controlled Substances Monitoring:          No data to display                (Please note that portions of this note were completed with a voice recognition program.  Efforts were made to edit the dictations but occasionally words are mis-transcribed.)    Matthias Hughs, MD (electronically signed)  Attending Emergency Physician           Matthias Hughs, MD  01/22/23 2231

## 2023-01-22 NOTE — ED Notes (Signed)
Discharged home to self.  Ambulatory out of ED.  VS WDL.  0 s/s acute distress.  Respirations even and unlabored.  Discharge instructions and follow up care reviewed.  Patient receptive and demonstrated knowledge of instruction via teach-back method.

## 2023-01-22 NOTE — ED Triage Notes (Signed)
Pt hasn't been able to pass urine since noon. Pt is having RLQ pain rated 10/10 at this time and not despite having the urge to urinate he is unable to.

## 2023-01-22 NOTE — ED Notes (Signed)
Pt stated that he just urinated and it was just blood.

## 2023-01-23 ENCOUNTER — Inpatient Hospital Stay
Admit: 2023-01-23 | Discharge: 2023-01-23 | Disposition: A | Discharge status: Home / Self Care | Payer: BLUE CROSS/BLUE SHIELD | Attending: Emergency Medicine

## 2023-01-23 DIAGNOSIS — N200 Calculus of kidney: Secondary | ICD-10-CM

## 2023-01-23 LAB — URINALYSIS, MICRO
BACTERIA, URINE: NEGATIVE /hpf
RBC, UA: >100 /hpf — ABNORMAL HIGH (ref 0–3)

## 2023-01-23 LAB — COMPREHENSIVE METABOLIC PANEL
ALT: 40 U/L (ref 12–78)
AST: 25 U/L (ref 15–37)
Albumin/Globulin Ratio: 1.1 (ref 1.1–2.2)
Albumin: 3.7 g/dL (ref 3.5–5.0)
Alk Phosphatase: 89 U/L (ref 45–117)
Anion Gap: 12 mmol/L (ref 5–15)
BUN/Creatinine Ratio: 13 (ref 12–20)
BUN: 17 mg/dL (ref 6–20)
CO2: 24 mmol/L (ref 21–32)
Calcium: 9.3 mg/dL (ref 8.5–10.1)
Chloride: 101 mmol/L (ref 97–108)
Creatinine: 1.35 mg/dL — ABNORMAL HIGH (ref 0.70–1.30)
Est, Glom Filt Rate: 64 mL/min/{1.73_m2} (ref >60)
Globulin: 3.3 g/dL (ref 2.0–4.0)
Glucose: 126 mg/dL — ABNORMAL HIGH (ref 65–100)
Potassium: 3.4 mmol/L — ABNORMAL LOW (ref 3.5–5.1)
Sodium: 137 mmol/L (ref 136–145)
Total Bilirubin: 0.6 mg/dL (ref 0.2–1.0)
Total Protein: 7 g/dL (ref 6.4–8.2)

## 2023-01-23 LAB — LIPASE: Lipase: 25 U/L (ref 13–75)

## 2023-01-23 LAB — URINALYSIS
Bilirubin Urine: NEGATIVE
Glucose, UA: NEGATIVE mg/dL
Leukocyte Esterase, Urine: NEGATIVE
Nitrite, Urine: NEGATIVE
Protein, UA: 100 mg/dL — AB
Specific Gravity, UA: >1.03 — ABNORMAL HIGH (ref 1.003–1.030)
Urobilinogen, Urine: 2 EU/dL — ABNORMAL HIGH (ref 0.2–1.0)
pH, Urine: 6 (ref 5.0–8.0)

## 2023-01-23 LAB — CBC WITH AUTO DIFFERENTIAL
Basophils %: 1 % (ref 0–1)
Basophils Absolute: 0.1 10*3/uL (ref 0.0–0.1)
Eosinophils %: 5 % (ref 0–7)
Eosinophils Absolute: 0.5 10*3/uL — ABNORMAL HIGH (ref 0.0–0.4)
Hematocrit: 43.5 % (ref 36.6–50.3)
Hemoglobin: 15.4 g/dL (ref 12.1–17.0)
Immature Granulocytes %: 0 % (ref 0–0.5)
Immature Granulocytes Absolute: 0 10*3/uL (ref 0.00–0.04)
Lymphocytes %: 32 % (ref 12–49)
Lymphocytes Absolute: 3.2 10*3/uL (ref 0.8–3.5)
MCH: 31.3 PG (ref 26.0–34.0)
MCHC: 35.4 g/dL (ref 30.0–36.5)
MCV: 88.4 FL (ref 80.0–99.0)
MPV: 9.4 FL (ref 8.9–12.9)
Monocytes %: 7 % (ref 5–13)
Monocytes Absolute: 0.7 10*3/uL (ref 0.0–1.0)
Neutrophils %: 55 % (ref 32–75)
Neutrophils Absolute: 5.6 10*3/uL (ref 1.8–8.0)
Nucleated RBCs: 0 PER 100 WBC
Platelets: 274 10*3/uL (ref 150–400)
RBC: 4.92 M/uL (ref 4.10–5.70)
RDW: 11.9 % (ref 11.5–14.5)
WBC: 10.2 10*3/uL (ref 4.1–11.1)
nRBC: 0 10*3/uL (ref 0.00–0.01)

## 2023-01-23 MED ORDER — ONDANSETRON HCL 4 MG/2ML IJ SOLN
4 | INTRAMUSCULAR | Status: AC
Start: 2023-01-23 — End: 2023-01-23
  Administered 2023-01-23: 06:00:00 4 mg via INTRAVENOUS

## 2023-01-23 MED ORDER — HYDROMORPHONE HCL 1 MG/ML IJ SOLN
1 | INTRAMUSCULAR | Status: AC
Start: 2023-01-23 — End: 2023-01-22
  Administered 2023-01-23: 03:00:00 0.5 mg via INTRAVENOUS

## 2023-01-23 MED ORDER — KETOROLAC TROMETHAMINE 30 MG/ML IJ SOLN
30 | INTRAMUSCULAR | Status: AC
Start: 2023-01-23 — End: 2023-01-22
  Administered 2023-01-23: 01:00:00 30 mg via INTRAVENOUS

## 2023-01-23 MED ORDER — SODIUM CHLORIDE 0.9 % IV SOLN
0.9 | INTRAVENOUS | Status: DC
Start: 2023-01-23 — End: 2023-01-23
  Administered 2023-01-23: 08:00:00 via INTRAVENOUS

## 2023-01-23 MED ORDER — FENTANYL CITRATE (PF) 100 MCG/2ML IJ SOLN
100 | INTRAMUSCULAR | Status: AC
Start: 2023-01-23 — End: 2023-01-23
  Administered 2023-01-23: 06:00:00 50 ug via INTRAVENOUS

## 2023-01-23 MED ORDER — FENTANYL CITRATE (PF) 100 MCG/2ML IJ SOLN
100 | INTRAMUSCULAR | Status: DC
Start: 2023-01-23 — End: 2023-01-23

## 2023-01-23 MED ORDER — SODIUM CHLORIDE 0.9 % IV BOLUS
0.9 | Freq: Once | INTRAVENOUS | Status: AC
Start: 2023-01-23 — End: 2023-01-23
  Administered 2023-01-23: 07:00:00 1000 mL via INTRAVENOUS

## 2023-01-23 MED FILL — KETOROLAC TROMETHAMINE 30 MG/ML IJ SOLN: 30 MG/ML | INTRAMUSCULAR | Qty: 1

## 2023-01-23 MED FILL — SODIUM CHLORIDE 0.9 % IV SOLN: 0.9 % | INTRAVENOUS | Qty: 1000

## 2023-01-23 MED FILL — FENTANYL CITRATE (PF) 100 MCG/2ML IJ SOLN: 100 MCG/2ML | INTRAMUSCULAR | Qty: 2

## 2023-01-23 MED FILL — HYDROMORPHONE HCL 1 MG/ML IJ SOLN: 1 MG/ML | INTRAMUSCULAR | Qty: 0.5

## 2023-01-23 MED FILL — ONDANSETRON HCL 4 MG/2ML IJ SOLN: 4 MG/2ML | INTRAMUSCULAR | Qty: 2

## 2023-01-23 MED FILL — DILAUDID 1 MG/ML IJ SOLN: 1 MG/ML | INTRAMUSCULAR | Qty: 1

## 2023-01-23 NOTE — ED Provider Notes (Addendum)
[facility] EMERGENCY DEPT  EMERGENCY DEPARTMENT ENCOUNTER      Pt Name: Alan Gomez  MRN: [id number]  Birthdate 1971/08/27  Date of evaluation: 01/23/2023  Provider: Matthias Hughs, MD  2:03 AM    CHIEF COMPLAINT       Chief Complaint   Patient presents with    Renal Calculi pain         HISTORY OF PRESENT ILLNESS    Alan Gomez is a 52 y.o. male who presents to the emergency department kidney stones.     HPI; patient was here earlier and treated for kidney stones.  Patient is from out of town and does not have a ride to the hotel patient stayed in the lobby and decided to re-check in for more pain meds.  And he states that he has blood in his urine.  Denies fevers or chills.  Patient is willing to wait until cab service is available.    Nursing Notes were reviewed.    REVIEW OF SYSTEMS       Review of Systems   Constitutional: Negative.    HENT: Negative.     Eyes: Negative.    Respiratory: Negative.     Cardiovascular: Negative.    Gastrointestinal:  Positive for abdominal pain.   Endocrine: Negative.    Genitourinary:  Positive for flank pain and hematuria.        Right   Skin: Negative.    Allergic/Immunologic: Negative.    Neurological: Negative.    Hematological: Negative.    Psychiatric/Behavioral: Negative.     All other systems reviewed and are negative.      Except as noted above the remainder of the review of systems was reviewed and negative.       PAST MEDICAL HISTORY     Past Medical History:   Diagnosis Date    Renal calculi          SURGICAL HISTORY     No past surgical history on file.      CURRENT MEDICATIONS       Previous Medications    ALBUTEROL SULFATE HFA (PROVENTIL;VENTOLIN;PROAIR) 108 (90 BASE) MCG/ACT INHALER        FLUTICASONE (FLONASE) 50 MCG/ACT NASAL SPRAY           ALLERGIES     Codeine, Oxycodone, and Percocet [oxycodone-acetaminophen]    FAMILY HISTORY     No family history on file.       SOCIAL HISTORY       Social History     Socioeconomic History    Marital status: Unknown        SCREENINGS         Glasgow Coma Scale  Eye Opening: Spontaneous  Best Verbal Response: Oriented  Best Motor Response: Obeys commands  Glasgow Coma Scale Score: 15                     CIWA Assessment  BP: 133/74  Pulse: 79                 PHYSICAL EXAM       ED Triage Vitals [01/23/23 0155]   BP Temp Temp src Pulse Respirations SpO2 Height Weight - Scale   133/74 98.1 F (36.7 C) -- 79 17 99 % 1.778 m ( ) 79.4 kg (175 lb)       Physical Exam  Vitals and nursing note reviewed.   Constitutional:       Appearance: Normal appearance. He  is normal weight.   HENT:      Head: Normocephalic.      Right Ear: Tympanic membrane normal.      Left Ear: Tympanic membrane normal.      Nose: Nose normal.      Mouth/Throat:      Mouth: Mucous membranes are moist.   Eyes:      Extraocular Movements: Extraocular movements intact.      Pupils: Pupils are equal, round, and reactive to light.   Cardiovascular:      Rate and Rhythm: Normal rate and regular rhythm.      Pulses: Normal pulses.      Heart sounds: Normal heart sounds.   Pulmonary:      Effort: Pulmonary effort is normal.   Abdominal:      Adahlia Stembridge: Abdomen is flat. Bowel sounds are normal.      Palpations: Abdomen is soft.   Musculoskeletal:         Araceli Coufal: Normal range of motion.      Cervical back: Normal range of motion and neck supple.   Skin:     Taqwa Deem: Skin is warm.      Capillary Refill: Capillary refill takes less than 2 seconds.   Neurological:      Galilea Quito: No focal deficit present.      Mental Status: He is alert and oriented to person, place, and time.   Psychiatric:         Mood and Affect: Mood normal.         DIAGNOSTIC RESULTS     EKG: All EKG's are interpreted by the Emergency Department Physician who either signs or Co-signs this chart in the absence of a cardiologist.        RADIOLOGY:   Non-plain film images such as CT, Ultrasound and MRI are read by the radiologist. Plain radiographic images are visualized and preliminarily interpreted by the  emergency physician with the below findings:        Interpretation per the Radiologist below, if available at the time of this note:    No orders to display         ED BEDSIDE ULTRASOUND:   Performed by ED Physician - none    LABS:  Labs Reviewed - No data to display    All other labs were within normal range or not returned as of this dictation.    EMERGENCY DEPARTMENT COURSE and DIFFERENTIAL DIAGNOSIS/MDM:   Vitals:    Vitals:    01/23/23 0155   BP: 133/74   Pulse: 79   Resp: 17   Temp: 98.1 F (36.7 C)   SpO2: 99%   Weight: 79.4 kg (175 lb)   Height: 1.778 m ( )           Medical Decision Making  Risk  Prescription drug management.    DDX: kidney stones        REASSESSMENT          CRITICAL CARE TIME   Total Critical Care time was  minutes, excluding separately reportable procedures.  There was a high probability of clinically significant/life threatening deterioration in the patient's condition which required my urgent intervention.      CONSULTS:  None    PROCEDURES:  Unless otherwise noted below, none     Procedures        FINAL IMPRESSION    No diagnosis found.      DISPOSITION/PLAN   DISPOSITION  PATIENT REFERRED TO:  No follow-up provider specified.    DISCHARGE MEDICATIONS:  New Prescriptions    No medications on file     Controlled Substances Monitoring:          No data to display                (Please note that portions of this note were completed with a voice recognition program.  Efforts were made to edit the dictations but occasionally words are mis-transcribed.)    Matthias Hughs, MD (electronically signed)  Attending Emergency Physician          Matthias Hughs, MD  01/23/23 [id number]       Matthias Hughs, MD  01/23/23 [phone]

## 2023-01-23 NOTE — ED Triage Notes (Signed)
Pt was just recently discharge from the ED after being diagnosed with a kidney stone. Pt states that he continues to have blood in his urine and is having a recurrence of his pain.

## 2023-01-23 NOTE — ED Notes (Signed)
Discharge instructions reviewed and pt verbalized understanding. Pt given number to cab company for way home. Pt ambulatory and stable at time of discharge.

## 2023-01-23 NOTE — Discharge Instructions (Addendum)
Liberal fluids. Follow-up with your urologist in [place] tomorrow. If symptoms get worse return to ED immediately.

## 2023-01-26 ENCOUNTER — Telehealth: Payer: Self-pay

## 2023-01-26 NOTE — Transitions of Care (Post Inpatient/ED Visit) (Signed)
   01/26/2023  Name: Arthur Kent MRN: [id number] DOB: 11-04-1970  Today's TOC FU Call Status: Unsuccessful Call (1st Attempt) Date: 01/26/23  Attempted to reach the patient regarding the most recent Inpatient/ED visit.  Follow Up Plan: Additional outreach attempts will be made to reach the patient to complete the Transitions of Care (Post Inpatient/ED visit) call.   Signature: Wilhemena Durie, CMA

## 2023-01-28 NOTE — Transitions of Care (Post Inpatient/ED Visit) (Signed)
   01/28/2023  Name: Arthur Kent MRN: [id number] DOB: Jun 19, 1971  Today's TOC FU Call Status: Today's TOC FU Call Status:: Unsuccessful Call (2nd Attempt) Unsuccessful Call (1st Attempt) Date: 01/26/23 Unsuccessful Call (2nd Attempt) Date: 01/28/23  Attempted to reach the patient regarding the most recent Inpatient/ED visit.  Follow Up Plan: Additional outreach attempts will be made to reach the patient to complete the Transitions of Care (Post Inpatient/ED visit) call.   Signature: Wilhemena Durie, CMA

## 2023-02-02 NOTE — Transitions of Care (Post Inpatient/ED Visit) (Signed)
   02/02/2023  Name: Ferd Horrigan MRN: [id number] DOB: August 07, 1971  Today's TOC FU Call Status: Today's TOC FU Call Status:: Unsuccessful Call (3rd Attempt) Unsuccessful Call (1st Attempt) Date: 01/26/23 Unsuccessful Call (2nd Attempt) Date: 01/28/23 Unsuccessful Call (3rd Attempt) Date: 02/02/23  Attempted to reach the patient regarding the most recent Inpatient/ED visit.  Follow Up Plan: No further outreach attempts will be made at this time. We have been unable to contact the patient.  Signature: Wilhemena Durie, CMA

## 2023-10-19 ENCOUNTER — Encounter: Payer: Self-pay | Admitting: Nurse Practitioner

## 2023-10-19 ENCOUNTER — Ambulatory Visit: Payer: Self-pay

## 2023-10-19 ENCOUNTER — Ambulatory Visit: Payer: BC Managed Care – PPO | Admitting: Nurse Practitioner

## 2023-10-19 VITALS — BP 106/72 | HR 81 | Temp 97.8°F | Ht 71.26 in | Wt 176.4 lb

## 2023-10-19 DIAGNOSIS — J029 Acute pharyngitis, unspecified: Secondary | ICD-10-CM | POA: Diagnosis not present

## 2023-10-19 DIAGNOSIS — Z0000 Encounter for general adult medical examination without abnormal findings: Secondary | ICD-10-CM

## 2023-10-19 DIAGNOSIS — R051 Acute cough: Secondary | ICD-10-CM | POA: Diagnosis not present

## 2023-10-19 DIAGNOSIS — Z136 Encounter for screening for cardiovascular disorders: Secondary | ICD-10-CM

## 2023-10-19 DIAGNOSIS — Z1211 Encounter for screening for malignant neoplasm of colon: Secondary | ICD-10-CM

## 2023-10-19 LAB — URINALYSIS, ROUTINE W REFLEX MICROSCOPIC
Bilirubin, UA: NEGATIVE
Glucose, UA: NEGATIVE
Ketones, UA: NEGATIVE
Leukocytes,UA: NEGATIVE
Nitrite, UA: NEGATIVE
RBC, UA: NEGATIVE
Specific Gravity, UA: >1.03 — ABNORMAL HIGH (ref 1.005–1.030)
Urobilinogen, Ur: 0.2 mg/dL (ref 0.2–1.0)
pH, UA: 5.5 (ref 5.0–7.5)

## 2023-10-19 MED ORDER — BENZONATATE 200 MG PO CAPS: 200.0000 mg | ORAL_CAPSULE | Freq: Two times a day (BID) | ORAL | 0 refills | Status: AC | PRN

## 2023-10-19 MED ORDER — AMOXICILLIN 500 MG PO CAPS: 500.0000 mg | ORAL_CAPSULE | Freq: Two times a day (BID) | ORAL | 0 refills | Status: AC

## 2023-10-19 NOTE — Telephone Encounter (Signed)
    Chief Complaint: Non-productive cough, wheezing. Symptoms: Above Frequency: Last week Pertinent Negatives: Patient denies fever Disposition: [] ED /[] Urgent Care (no appt availability in office) / [x] Appointment(In office/virtual)/ []  [facility]/ [] Home Care/ [] Refused Recommended Disposition /[] [facility]/ []  Follow-up with PCP Additional Notes: Pt. Already has appointment this morning.  Reason for Disposition  [1] MILD difficulty breathing (e.g., minimal/no SOB at rest, SOB with walking, pulse <100) AND [2] still present when not coughing  Answer Assessment - Initial Assessment Questions 1. ONSET: When did the cough begin?      Last Tuesday 2. SEVERITY: How bad is the cough today?      Severe 3. SPUTUM: Describe the color of your sputum (none, dry cough; clear, white, yellow, green)     None 4. HEMOPTYSIS: Are you coughing up any blood? If so ask: How much? (flecks, streaks, tablespoons, etc.)     None 5. DIFFICULTY BREATHING: Are you having difficulty breathing? If Yes, ask: How bad is it? (e.g., mild, moderate, severe)    - MILD: No SOB at rest, mild SOB with walking, speaks normally in sentences, can lie down, no retractions, pulse < 100.    - MODERATE: SOB at rest, SOB with minimal exertion and prefers to sit, cannot lie down flat, speaks in phrases, mild retractions, audible wheezing, pulse 100-120.    - SEVERE: Very SOB at rest, speaks in single words, struggling to breathe, sitting hunched forward, retractions, pulse > 120      Mild 6. FEVER: Do you have a fever? If Yes, ask: What is your temperature, how was it measured, and when did it start?     No 7. CARDIAC HISTORY: Do you have any history of heart disease? (e.g., heart attack, congestive heart failure)      No 8. LUNG HISTORY: Do you have any history of lung disease?  (e.g., pulmonary embolus, asthma, emphysema)     No 9. PE RISK FACTORS: Do you have a history of blood  clots? (or: recent major surgery, recent prolonged travel, bedridden)     No 10. OTHER SYMPTOMS: Do you have any other symptoms? (e.g., runny nose, wheezing, chest pain)       Wheezing 11. PREGNANCY: Is there any chance you are pregnant? When was your last menstrual period?       N/a 12. TRAVEL: Have you traveled out of the country in the last month? (e.g., travel history, exposures)       no  Protocols used: Cough - Acute Non-Productive-A-AH

## 2023-10-19 NOTE — Progress Notes (Signed)
 BP 106/72 (BP Location: Left Arm, Patient Position: Sitting, Cuff Size: Large)   Pulse 81   Temp 97.8 F (36.6 C) (Oral)   Ht 5' 11.26 (1.81 m)   Wt 176 lb 6.4 oz (80 kg)   SpO2 94%   BMI 24.42 kg/m    Subjective:    Patient ID: Arthur Kent, male    DOB: Jul 12, 1971, 53 y.o.   MRN: [id number]  HPI: Arthur Kent is a 53 y.o. male presenting on 10/19/2023 for comprehensive medical examination. Current medical complaints include: cough and congestion  He currently lives with: Interim Problems from his last visit: no  UPPER RESPIRATORY TRACT INFECTION Worst symptom: 6 days ago Fever: no Cough: yes Shortness of breath: yes Wheezing: yes Chest pain: no Chest tightness: no Chest congestion: yes Nasal congestion: yes Runny nose: yes Post nasal drip: yes Sneezing: no Sore throat: yes Swollen glands: yes Sinus pressure: no Headache: no Face pain: no Toothache: no Ear pain: no bilateral Ear pressure: no bilateral Eyes red/itching:no Eye drainage/crusting: no  Vomiting: no Rash: no Fatigue: yes Sick contacts: no Strep contacts: no  Context: stable Recurrent sinusitis: no Relief with OTC cold/cough medications: no Treatments attempted: mucinex and cough syrup    Depression Screen done today and results listed below:     10/19/2023   11:39 AM 10/15/2022    1:31 PM  Depression screen PHQ 2/9  Decreased Interest 0 0  Down, Depressed, Hopeless 0 0  PHQ - 2 Score 0 0  Altered sleeping 1 0  Tired, decreased energy 1 0  Change in appetite 1 0  Feeling bad or failure about yourself  0 0  Trouble concentrating 0 0  Moving slowly or fidgety/restless 0 0  Suicidal thoughts 0 0  PHQ-9 Score 3 0  Difficult doing work/chores  Not difficult at all    The patient does not have a history of falls. I did complete a risk assessment for falls. A plan of care for falls was documented.   Past Medical History:  Past Medical History:  Diagnosis Date  . Kidney  stone     Surgical History:  Past Surgical History:  Procedure Laterality Date  . FACIAL COSMETIC SURGERY      Medications:  No current outpatient medications on file prior to visit.   No current facility-administered medications on file prior to visit.    Allergies:  Allergies  Allergen Reactions  . Codeine   . Oxycodone Itching  . Percocet [Oxycodone-Acetaminophen ] Itching    Social History:  Social History   Socioeconomic History  . Marital status: Single    Spouse name: Not on file  . Number of children: Not on file  . Years of education: Not on file  . Highest education level: Not on file  Occupational History  . Not on file  Tobacco Use  . Smoking status: Every Day    Current packs/day: 0.25    Types: Cigarettes  . Smokeless tobacco: Never  Vaping Use  . Vaping status: Never Used  Substance and Sexual Activity  . Alcohol use: Yes    Comment: occasionally  . Drug use: Never  . Sexual activity: Yes  Other Topics Concern  . Not on file  Social History Narrative  . Not on file   Social Drivers of Health   Financial Resource Strain: Not on file  Food Insecurity: Not on file  Transportation Needs: Not on file  Physical Activity: Not on file  Stress: Not  on file  Social Connections: Not on file  Intimate Partner Violence: Not on file   Social History   Tobacco Use  Smoking Status Every Day  . Current packs/day: 0.25  . Types: Cigarettes  Smokeless Tobacco Never   Social History   Substance and Sexual Activity  Alcohol Use Yes   Comment: occasionally    Family History:  Family History  Problem Relation Age of Onset  . Rectal cancer Sister   . Lung cancer Maternal Grandmother   . Stroke Maternal Grandfather   . Lung cancer Paternal Grandmother   . Stroke Paternal Grandfather     Past medical history, surgical history, medications, allergies, family history and social history reviewed with patient today and changes made to appropriate  areas of the chart.   Review of Systems  Constitutional:  Positive for malaise/fatigue.  HENT:  Positive for congestion, sinus pain and sore throat. Negative for ear pain.   Eyes:  Negative for pain, discharge and redness.  Respiratory:  Positive for cough, shortness of breath and wheezing.   Cardiovascular:  Negative for chest pain.  Gastrointestinal:  Negative for vomiting.  Skin:  Negative for rash.  Neurological:  Positive for headaches.   All other ROS negative except what is listed above and in the HPI.      Objective:    BP 106/72 (BP Location: Left Arm, Patient Position: Sitting, Cuff Size: Large)   Pulse 81   Temp 97.8 F (36.6 C) (Oral)   Ht 5' 11.26 (1.81 m)   Wt 176 lb 6.4 oz (80 kg)   SpO2 94%   BMI 24.42 kg/m   Wt Readings from Last 3 Encounters:  10/19/23 176 lb 6.4 oz (80 kg)  10/15/22 179 lb 6.4 oz (81.4 kg)  11/23/19 175 lb (79.4 kg)    Physical Exam Vitals and nursing note reviewed.  Constitutional:      General: He is not in acute distress.    Appearance: Normal appearance. He is not ill-appearing, toxic-appearing or diaphoretic.  HENT:     Head: Normocephalic.     Right Ear: Ear canal and external ear normal. A middle ear effusion is present.     Left Ear: Ear canal and external ear normal. A middle ear effusion is present.     Nose: Congestion and rhinorrhea present.     Right Sinus: No maxillary sinus tenderness or frontal sinus tenderness.     Left Sinus: No maxillary sinus tenderness or frontal sinus tenderness.     Mouth/Throat:     Mouth: Mucous membranes are moist.     Pharynx: Posterior oropharyngeal erythema present.  Eyes:     General:        Right eye: No discharge.        Left eye: No discharge.     Extraocular Movements: Extraocular movements intact.     Conjunctiva/sclera: Conjunctivae normal.     Pupils: Pupils are equal, round, and reactive to light.  Cardiovascular:     Rate and Rhythm: Normal rate and regular rhythm.      Heart sounds: No murmur heard. Pulmonary:     Effort: Pulmonary effort is normal. No respiratory distress.     Breath sounds: Normal breath sounds. No wheezing, rhonchi or rales.  Abdominal:     General: Abdomen is flat. Bowel sounds are normal. There is no distension.     Palpations: Abdomen is soft.     Tenderness: There is no abdominal tenderness. There is no guarding.  Musculoskeletal:     Cervical back: Normal range of motion and neck supple.  Skin:    General: Skin is warm and dry.     Capillary Refill: Capillary refill takes less than 2 seconds.  Neurological:     General: No focal deficit present.     Mental Status: He is alert and oriented to person, place, and time.     Cranial Nerves: No cranial nerve deficit.     Motor: No weakness.     Deep Tendon Reflexes: Reflexes normal.  Psychiatric:        Mood and Affect: Mood normal.        Behavior: Behavior normal.        Thought Content: Thought content normal.        Judgment: Judgment normal.    Results for orders placed or performed in visit on 10/15/22  Urinalysis, Routine w reflex microscopic   Collection Time: 10/15/22  1:51 PM  Result Value Ref Range   Specific Gravity, UA >1.030 (H) 1.005 - 1.030   pH, UA 5.0 5.0 - 7.5   Color, UA Yellow Yellow   Appearance Ur Clear Clear   Leukocytes,UA Negative Negative   Protein,UA Negative Negative/Trace   Glucose, UA Negative Negative   Ketones, UA Negative Negative   RBC, UA Negative Negative   Bilirubin, UA Negative Negative   Urobilinogen, Ur 0.2 0.2 - 1.0 mg/dL   Nitrite, UA Negative Negative   Microscopic Examination Comment   TSH   Collection Time: 10/15/22  1:54 PM  Result Value Ref Range   TSH 1.530 0.450 - 4.500 uIU/mL  PSA   Collection Time: 10/15/22  1:54 PM  Result Value Ref Range   Prostate Specific Ag, Serum 1.9 0.0 - 4.0 ng/mL  Lipid panel   Collection Time: 10/15/22  1:54 PM  Result Value Ref Range   Cholesterol, Total 210 (H) 100 - 199 mg/dL    Triglycerides 835 (H) 0 - 149 mg/dL   HDL 45 >60 mg/dL   VLDL Cholesterol Cal 29 5 - 40 mg/dL   LDL Chol Calc (NIH) 863 (H) 0 - 99 mg/dL   Chol/HDL Ratio 4.7 0.0 - 5.0 ratio  CBC with Differential/Platelet   Collection Time: 10/15/22  1:54 PM  Result Value Ref Range   WBC 6.7 3.4 - 10.8 x10E3/uL   RBC 5.37 4.14 - 5.80 x10E6/uL   Hemoglobin 16.5 13.0 - 17.7 g/dL   Hematocrit 52.1 62.4 - 51.0 %   MCV 89 79 - 97 fL   MCH 30.7 26.6 - 33.0 pg   MCHC 34.5 31.5 - 35.7 g/dL   RDW 87.7 88.3 - 84.5 %   Platelets 277 150 - 450 x10E3/uL   Neutrophils 56 Not Estab. %   Lymphs 26 Not Estab. %   Monocytes 8 Not Estab. %   Eos 9 Not Estab. %   Basos 1 Not Estab. %   Neutrophils Absolute 3.7 1.4 - 7.0 x10E3/uL   Lymphocytes Absolute 1.7 0.7 - 3.1 x10E3/uL   Monocytes Absolute 0.5 0.1 - 0.9 x10E3/uL   EOS (ABSOLUTE) 0.6 (H) 0.0 - 0.4 x10E3/uL   Basophils Absolute 0.1 0.0 - 0.2 x10E3/uL   Immature Granulocytes 0 Not Estab. %   Immature Grans (Abs) 0.0 0.0 - 0.1 x10E3/uL  Comprehensive metabolic panel   Collection Time: 10/15/22  1:54 PM  Result Value Ref Range   Glucose 79 70 - 99 mg/dL   BUN 13 6 - 24 mg/dL   Creatinine,  Ser 1.01 0.76 - 1.27 mg/dL   eGFR 91 >40 fO/fpw/8.26   BUN/Creatinine Ratio 13 9 - 20   Sodium 143 134 - 144 mmol/L   Potassium 3.9 3.5 - 5.2 mmol/L   Chloride 105 96 - 106 mmol/L   CO2 26 20 - 29 mmol/L   Calcium 10.1 8.7 - 10.2 mg/dL   Total Protein 6.8 6.0 - 8.5 g/dL   Albumin 4.6 4.1 - 5.1 g/dL   Globulin, Total 2.2 1.5 - 4.5 g/dL   Albumin/Globulin Ratio 2.1 1.2 - 2.2   Bilirubin Total 0.5 0.0 - 1.2 mg/dL   Alkaline Phosphatase 95 44 - 121 IU/L   AST 18 0 - 40 IU/L   ALT 33 0 - 44 IU/L  HIV Antibody (routine testing w rflx)   Collection Time: 10/15/22  1:54 PM  Result Value Ref Range   HIV Screen 4th Generation wRfx Non Reactive Non Reactive  Hepatitis C Antibody   Collection Time: 10/15/22  1:54 PM  Result Value Ref Range   Hep C Virus Ab Non Reactive Non  Reactive      Assessment & Plan:   Problem List Items Addressed This Visit   None Visit Diagnoses       Annual physical exam    -  Primary   Health maintenance reviewed during visit today.  Labs ordered.  Vaccines discussed during visit.  Colonoscopy ordered.   Relevant Orders   TSH   PSA   Lipid panel   CBC with Differential/Platelet   Comprehensive metabolic panel   Urinalysis, Routine w reflex microscopic     Screening for ischemic heart disease       Relevant Orders   Lipid panel     Acute cough       Flu and strep neg. Will treat with amoxicillin  due to length of symptoms.  Recommend OTC symptom management.  Rest and stay well hydrated. Follow up if needed.   Relevant Orders   Veritor Flu A/B Waived   Novel Coronavirus, NAA ([facility])     Sore throat       Relevant Orders   Rapid Strep screen([facility])     Screening for colon cancer       Relevant Orders   Ambulatory referral to Gastroenterology        Discussed aspirin prophylaxis for myocardial infarction prevention and decision was it was not indicated  LABORATORY TESTING:  Health maintenance labs ordered today as discussed above.   The natural history of prostate cancer and ongoing controversy regarding screening and potential treatment outcomes of prostate cancer has been discussed with the patient. The meaning of a false positive PSA and a false negative PSA has been discussed. He indicates understanding of the limitations of this screening test and wishes to proceed with screening PSA testing.   IMMUNIZATIONS:   - Tdap: Tetanus vaccination status reviewed: Refused. - Influenza: Refused - Pneumovax: Not applicable - Prevnar: Not applicable - COVID: Refused - HPV: Not applicable - Shingrix vaccine:  Will get at a later date  SCREENING: - Colonoscopy: Ordered today  Discussed with patient purpose of the colonoscopy is to detect colon cancer at curable precancerous or early stages   - AAA  Screening: Not applicable  -Hearing Test: Not applicable  -Spirometry: Not applicable   PATIENT COUNSELING:    Sexuality: Discussed sexually transmitted diseases, partner selection, use of condoms, avoidance of unintended pregnancy  and contraceptive alternatives.   Advised to avoid cigarette smoking.  I discussed  with the patient that most people either abstain from alcohol or drink within safe limits (<=14/week and <=4 drinks/occasion for males, <=7/weeks and <= 3 drinks/occasion for females) and that the risk for alcohol disorders and other health effects rises proportionally with the number of drinks per week and how often a drinker exceeds daily limits.  Discussed cessation/primary prevention of drug use and availability of treatment for abuse.   Diet: Encouraged to adjust caloric intake to maintain  or achieve ideal body weight, to reduce intake of dietary saturated fat and total fat, to limit sodium intake by avoiding high sodium foods and not adding table salt, and to maintain adequate dietary potassium and calcium preferably from fresh fruits, vegetables, and low-fat dairy products.    stressed the importance of regular exercise  Injury prevention: Discussed safety belts, safety helmets, smoke detector, smoking near bedding or upholstery.   Dental health: Discussed importance of regular tooth brushing, flossing, and dental visits.   Follow up plan: NEXT PREVENTATIVE PHYSICAL DUE IN 1 YEAR. Return in about 1 year (around 10/18/2024) for Physical and Fasting labs.

## 2023-10-20 LAB — CBC WITH DIFFERENTIAL/PLATELET
Basophils Absolute: 0 10*3/uL (ref 0.0–0.2)
Basos: 0 %
EOS (ABSOLUTE): 0.4 10*3/uL (ref 0.0–0.4)
Eos: 4 %
Hematocrit: 45.3 % (ref 37.5–51.0)
Hemoglobin: 15.5 g/dL (ref 13.0–17.7)
Immature Grans (Abs): 0 10*3/uL (ref 0.0–0.1)
Immature Granulocytes: 0 %
Lymphocytes Absolute: 1.7 10*3/uL (ref 0.7–3.1)
Lymphs: 17 %
MCH: 31.5 pg (ref 26.6–33.0)
MCHC: 34.2 g/dL (ref 31.5–35.7)
MCV: 92 fL (ref 79–97)
Monocytes Absolute: 0.6 10*3/uL (ref 0.1–0.9)
Monocytes: 6 %
Neutrophils Absolute: 7.3 10*3/uL — ABNORMAL HIGH (ref 1.4–7.0)
Neutrophils: 73 %
Platelets: 238 10*3/uL (ref 150–450)
RBC: 4.92 x10E6/uL (ref 4.14–5.80)
RDW: 12.1 % (ref 11.6–15.4)
WBC: 10 10*3/uL (ref 3.4–10.8)

## 2023-10-20 LAB — COMPREHENSIVE METABOLIC PANEL
ALT: 30 [IU]/L (ref 0–44)
AST: 26 [IU]/L (ref 0–40)
Albumin: 4.3 g/dL (ref 4.1–5.1)
Alkaline Phosphatase: 109 [IU]/L (ref 44–121)
BUN/Creatinine Ratio: 16 (ref 9–20)
BUN: 16 mg/dL (ref 6–24)
Bilirubin Total: 0.4 mg/dL (ref 0.0–1.2)
CO2: 22 mmol/L (ref 20–29)
Calcium: 9.8 mg/dL (ref 8.7–10.2)
Chloride: 105 mmol/L (ref 96–106)
Creatinine, Ser: 0.98 mg/dL (ref 0.76–1.27)
Globulin, Total: 2.3 g/dL (ref 1.5–4.5)
Glucose: 91 mg/dL (ref 70–99)
Potassium: 4.1 mmol/L (ref 3.5–5.2)
Sodium: 142 mmol/L (ref 134–144)
Total Protein: 6.6 g/dL (ref 6.0–8.5)
eGFR: 94 mL/min/{1.73_m2} (ref >59)

## 2023-10-20 LAB — LIPID PANEL
Chol/HDL Ratio: 5.2 {ratio} — ABNORMAL HIGH (ref 0.0–5.0)
Cholesterol, Total: 178 mg/dL (ref 100–199)
HDL: 34 mg/dL — ABNORMAL LOW (ref >39)
LDL Chol Calc (NIH): 118 mg/dL — ABNORMAL HIGH (ref 0–99)
Triglycerides: 142 mg/dL (ref 0–149)
VLDL Cholesterol Cal: 26 mg/dL (ref 5–40)

## 2023-10-20 LAB — TSH: TSH: 0.994 u[IU]/mL (ref 0.450–4.500)

## 2023-10-20 LAB — PSA: Prostate Specific Ag, Serum: 0.7 ng/mL (ref 0.0–4.0)

## 2023-10-21 LAB — CULTURE, GROUP A STREP

## 2023-10-21 LAB — NOVEL CORONAVIRUS, NAA: SARS-CoV-2, NAA: NOT DETECTED

## 2023-10-21 LAB — VERITOR FLU A/B WAIVED
Influenza A: NEGATIVE
Influenza B: NEGATIVE

## 2023-10-21 LAB — RAPID STREP SCREEN (MED CTR MEBANE ONLY): Strep Gp A Ag, IA W/Reflex: NEGATIVE

## 2023-11-02 ENCOUNTER — Encounter: Payer: Self-pay | Admitting: *Deleted

## 2024-04-03 ENCOUNTER — Other Ambulatory Visit: Payer: Self-pay

## 2024-04-03 ENCOUNTER — Emergency Department

## 2024-04-03 ENCOUNTER — Emergency Department
Admission: EM | Admit: 2024-04-03 | Discharge: 2024-04-03 | Disposition: A | Discharge status: Home / Self Care | Attending: Emergency Medicine | Admitting: Emergency Medicine

## 2024-04-03 DIAGNOSIS — N200 Calculus of kidney: Secondary | ICD-10-CM | POA: Insufficient documentation

## 2024-04-03 DIAGNOSIS — R109 Unspecified abdominal pain: Secondary | ICD-10-CM

## 2024-04-03 DIAGNOSIS — R1031 Right lower quadrant pain: Secondary | ICD-10-CM | POA: Diagnosis present

## 2024-04-03 LAB — CBC
HCT: 48.6 % (ref 39.0–52.0)
Hemoglobin: 16.8 g/dL (ref 13.0–17.0)
MCH: 31.8 pg (ref 26.0–34.0)
MCHC: 34.6 g/dL (ref 30.0–36.0)
MCV: 91.9 fL (ref 80.0–100.0)
Platelets: 236 10*3/uL (ref 150–400)
RBC: 5.29 MIL/uL (ref 4.22–5.81)
RDW: 12.9 % (ref 11.5–15.5)
WBC: 6.4 10*3/uL (ref 4.0–10.5)
nRBC: 0 % (ref 0.0–0.2)

## 2024-04-03 LAB — COMPREHENSIVE METABOLIC PANEL WITH GFR
ALT: 21 U/L (ref 0–44)
AST: 25 U/L (ref 15–41)
Albumin: 4 g/dL (ref 3.5–5.0)
Alkaline Phosphatase: 69 U/L (ref 38–126)
Anion gap: 7 (ref 5–15)
BUN: 9 mg/dL (ref 6–20)
CO2: 27 mmol/L (ref 22–32)
Calcium: 9.3 mg/dL (ref 8.9–10.3)
Chloride: 108 mmol/L (ref 98–111)
Creatinine, Ser: 0.94 mg/dL (ref 0.61–1.24)
GFR, Estimated: >60 mL/min (ref >60)
Glucose, Bld: 103 mg/dL — ABNORMAL HIGH (ref 70–99)
Potassium: 4.1 mmol/L (ref 3.5–5.1)
Sodium: 142 mmol/L (ref 135–145)
Total Bilirubin: 0.9 mg/dL (ref 0.0–1.2)
Total Protein: 6.9 g/dL (ref 6.5–8.1)

## 2024-04-03 LAB — URINALYSIS, ROUTINE W REFLEX MICROSCOPIC
Bilirubin Urine: NEGATIVE
Glucose, UA: NEGATIVE mg/dL
Hgb urine dipstick: NEGATIVE
Ketones, ur: NEGATIVE mg/dL
Leukocytes,Ua: NEGATIVE
Nitrite: NEGATIVE
Protein, ur: NEGATIVE mg/dL
Specific Gravity, Urine: 1.016 (ref 1.005–1.030)
pH: 5 (ref 5.0–8.0)

## 2024-04-03 LAB — LIPASE, BLOOD: Lipase: 36 U/L (ref 11–51)

## 2024-04-03 MED ORDER — KETOROLAC TROMETHAMINE 15 MG/ML IJ SOLN
15.0000 mg | Freq: Once | INTRAMUSCULAR | Status: AC
Start: 2024-04-03 — End: 2024-04-03
  Administered 2024-04-03: 15 mg via INTRAVENOUS
  Filled 2024-04-03: qty 1

## 2024-04-03 MED ORDER — SODIUM CHLORIDE 0.9 % IV BOLUS
1000.0000 mL | Freq: Once | INTRAVENOUS | Status: AC
  Administered 2024-04-03: 1000 mL via INTRAVENOUS

## 2024-04-03 MED ORDER — KETOROLAC TROMETHAMINE 15 MG/ML IJ SOLN
15.0000 mg | Freq: Once | INTRAMUSCULAR | Status: AC
  Administered 2024-04-03: 15 mg via INTRAVENOUS
  Filled 2024-04-03: qty 1

## 2024-04-03 NOTE — ED Triage Notes (Signed)
 Pt sts that for the last four days he has been having right flank pain. Pt sts that Thursday night he heard a tink in the toilet while urinating and the pain went away however it is back again.

## 2024-04-03 NOTE — Discharge Instructions (Signed)
 You have multiple nonobstructing stones in your bilateral kidneys.  Please follow-up with urology.  Please return for any new, worsening, or change in symptoms or other concerns.  It was a pleasure caring for you today.

## 2024-04-03 NOTE — ED Provider Notes (Signed)
 [facility] Provider Note    Event Date/Time   First MD Initiated Contact with Patient 04/03/24 1055     (approximate)   History   Flank Pain   HPI  Arthur Kent is a 53 y.o. male with a past medical history of kidney stones who presents today for evaluation of right sided flank pain radiating to his right lower quadrant.  Patient reports that his symptoms began a couple of days ago but then he thought that he had passed a stone because he heard it landed in the toilet bowl.  However, he reports that 2 nights ago his pain returned.  No nausea or vomiting.  No fevers or chills.  No dysuria or hematuria.  There are no active problems to display for this patient.         Physical Exam   Triage Vital Signs: ED Triage Vitals [04/03/24 1022]  Encounter Vitals Group     BP 115/87     Girls Systolic BP Percentile      Girls Diastolic BP Percentile      Boys Systolic BP Percentile      Boys Diastolic BP Percentile      Pulse Rate 82     Resp 17     Temp 97.9 F (36.6 C)     Temp Source Oral     SpO2 98 %     Weight 175 lb (79.4 kg)     Height 5' 10 (1.778 m)     Head Circumference      Peak Flow      Pain Score 10     Pain Loc      Pain Education      Exclude from Growth Chart     Most recent vital signs: Vitals:   04/03/24 1022  BP: 115/87  Pulse: 82  Resp: 17  Temp: 97.9 F (36.6 C)  SpO2: 98%    Physical Exam Vitals and nursing note reviewed.  Constitutional:      General: Awake and alert. No acute distress.    Appearance: Normal appearance. The patient is normal weight.  HENT:     Head: Normocephalic and atraumatic.     Mouth: Mucous membranes are moist.  Eyes:     General: PERRL. Normal EOMs        Right eye: No discharge.        Left eye: No discharge.     Conjunctiva/sclera: Conjunctivae normal.  Cardiovascular:     Rate and Rhythm: Normal rate and regular rhythm.     Pulses: Normal pulses.  Pulmonary:      Effort: Pulmonary effort is normal. No respiratory distress.     Breath sounds: Normal breath sounds.  Abdominal:     Abdomen is soft. There is right lower quadrant and suprapubic abdominal tenderness. No rebound or guarding. No distention.  Right CVA tenderness present. Musculoskeletal:        General: No swelling. Normal range of motion.     Cervical back: Normal range of motion and neck supple.  Skin:    General: Skin is warm and dry.     Capillary Refill: Capillary refill takes less than 2 seconds.     Findings: No rash.  Neurological:     Mental Status: The patient is awake and alert.      ED Results / Procedures / Treatments   Labs (all labs ordered are listed, but only abnormal results are displayed) Labs Reviewed  COMPREHENSIVE METABOLIC PANEL WITH GFR - Abnormal; Notable for the following components:      Result Value   Glucose, Bld 103 (*)    All other components within normal limits  URINALYSIS, ROUTINE W REFLEX MICROSCOPIC - Abnormal; Notable for the following components:   Color, Urine YELLOW (*)    APPearance CLEAR (*)    All other components within normal limits  LIPASE, BLOOD  CBC     EKG     RADIOLOGY I independently reviewed and interpreted imaging and agree with radiologists findings.     PROCEDURES:  Critical Care performed:   Procedures   MEDICATIONS ORDERED IN ED: Medications  ketorolac  (TORADOL ) 15 MG/ML injection 15 mg (15 mg Intravenous Given 04/03/24 1112)  sodium chloride  0.9 % bolus 1,000 mL (0 mLs Intravenous Stopped 04/03/24 1250)  ketorolac  (TORADOL ) 15 MG/ML injection 15 mg (15 mg Intravenous Given 04/03/24 1150)     IMPRESSION / MDM / ASSESSMENT AND PLAN / ED COURSE  I reviewed the triage vital signs and the nursing notes.   Differential diagnosis includes, but is not limited to, ureteral colic, nephrolithiasis, appendicitis, UTI/pyelonephritis  Patient is awake and alert, hemodynamically stable and afebrile but  uncomfortable in appearance.  I reviewed the patient's chart.  He was seen at outside emergency department in April of last year also for a kidney stone.  Labs obtained in triage are overall reassuring.  Urinalysis is without evidence of infection.  CT renal stone obtained reveals a normal appendix though does demonstrate multiple punctate nonobstructing renal stones.  I suspect that he is passing little stones.  Upon reevaluation he reports that his pain is resolved.  I recommended outpatient follow-up with urology for further workup and management.  In the meantime we discussed strict return precautions.  Patient understands and agrees with plan.  He was discharged in stable condition.   Patient's presentation is most consistent with acute complicated illness / injury requiring diagnostic workup.   Clinical Course as of 04/03/24 1326  Sun Apr 03, 2024  1307 Patient reports pain has improved significantly and he feels ready for discharge home [JP]    Clinical Course User Index [JP] Dimonique Bourdeau E, PA-C     FINAL CLINICAL IMPRESSION(S) / ED DIAGNOSES   Final diagnoses:  Kidney stone  Right flank pain     Rx / DC Orders   ED Discharge Orders     None        Note:  This document was prepared using Dragon voice recognition software and may include unintentional dictation errors.   Luv Mish E, PA-C 04/03/24 1326    Willo Dunnings, MD 04/03/24 1535

## 2024-10-19 ENCOUNTER — Encounter: Payer: Self-pay | Admitting: Nurse Practitioner

## 2024-10-19 DIAGNOSIS — E7800 Pure hypercholesterolemia, unspecified: Secondary | ICD-10-CM | POA: Insufficient documentation

## 2024-10-19 NOTE — Progress Notes (Deleted)
 "  There were no vitals taken for this visit.   Subjective:    Patient ID: Arthur Kent, male    DOB: 04/20/1971, 54 y.o.   MRN: [id number]  HPI: Arthur Kent is a 54 y.o. male presenting on 10/19/2024 for comprehensive medical examination. Current medical complaints include:{Blank single:19197::none,***}  He currently lives with: Interim Problems from his last visit: {Blank single:19197::yes,no}  Depression Screen done today and results listed below:     10/19/2023   11:39 AM 10/15/2022    1:31 PM  Depression screen PHQ 2/9  Decreased Interest 0 0  Down, Depressed, Hopeless 0 0  PHQ - 2 Score 0 0  Altered sleeping 1 0  Tired, decreased energy 1 0  Change in appetite 1 0  Feeling bad or failure about yourself  0 0  Trouble concentrating 0 0  Moving slowly or fidgety/restless 0 0  Suicidal thoughts 0 0  PHQ-9 Score 3  0   Difficult doing work/chores  Not difficult at all     Data saved with a previous flowsheet row definition    The patient {has/does not yjcz:80150} a history of falls. I {did/did not:19850} complete a risk assessment for falls. A plan of care for falls {was/was not:19852} documented.   Past Medical History:  Past Medical History:  Diagnosis Date   Kidney stone     Surgical History:  Past Surgical History:  Procedure Laterality Date   FACIAL COSMETIC SURGERY      Medications:  Current Outpatient Medications on File Prior to Visit  Medication Sig   benzonatate  (TESSALON ) 200 MG capsule Take 1 capsule (200 mg total) by mouth 2 (two) times daily as needed for cough.   No current facility-administered medications on file prior to visit.    Allergies:  Allergies[1]  Social History:  Social History   Socioeconomic History   Marital status: Single    Spouse name: Not on file   Number of children: Not on file   Years of education: Not on file   Highest education level: Not on file  Occupational History   Not on file   Tobacco Use   Smoking status: Every Day    Current packs/day: 0.25    Types: Cigarettes   Smokeless tobacco: Never  Vaping Use   Vaping status: Never Used  Substance and Sexual Activity   Alcohol use: Yes    Comment: occasionally   Drug use: Never   Sexual activity: Yes  Other Topics Concern   Not on file  Social History Narrative   Not on file   Social Drivers of Health   Tobacco Use: High Risk (10/19/2023)   Patient History    Smoking Tobacco Use: Every Day    Smokeless Tobacco Use: Never    Passive Exposure: Not on file  Financial Resource Strain: Not on file  Food Insecurity: Not on file  Transportation Needs: Not on file  Physical Activity: Not on file  Stress: Not on file  Social Connections: Not on file  Intimate Partner Violence: Not on file  Depression (PHQ2-9): Low Risk (10/19/2023)   Depression (PHQ2-9)    PHQ-2 Score: 3  Alcohol Screen: Not on file  Housing: Not on file  Utilities: Not on file  Health Literacy: Not on file   Tobacco Use History[2] Social History   Substance and Sexual Activity  Alcohol Use Yes   Comment: occasionally    Family History:  Family History  Problem Relation Age of Onset  Rectal cancer Sister    Lung cancer Maternal Grandmother    Stroke Maternal Grandfather    Lung cancer Paternal Grandmother    Stroke Paternal Grandfather     Past medical history, surgical history, medications, allergies, family history and social history reviewed with patient today and changes made to appropriate areas of the chart.   ROS All other ROS negative except what is listed above and in the HPI.      Objective:    There were no vitals taken for this visit.  Wt Readings from Last 3 Encounters:  04/03/24 175 lb (79.4 kg)  10/19/23 176 lb 6.4 oz (80 kg)  10/15/22 179 lb 6.4 oz (81.4 kg)    Physical Exam  Results for orders placed or performed during the hospital encounter of 04/03/24  Lipase, blood   Collection Time: 04/03/24  10:24 AM  Result Value Ref Range   Lipase 36 11 - 51 U/L  Comprehensive metabolic panel   Collection Time: 04/03/24 10:24 AM  Result Value Ref Range   Sodium 142 135 - 145 mmol/L   Potassium 4.1 3.5 - 5.1 mmol/L   Chloride 108 98 - 111 mmol/L   CO2 27 22 - 32 mmol/L   Glucose, Bld 103 (H) 70 - 99 mg/dL   BUN 9 6 - 20 mg/dL   Creatinine, Ser 9.05 0.61 - 1.24 mg/dL   Calcium 9.3 8.9 - 89.6 mg/dL   Total Protein 6.9 6.5 - 8.1 g/dL   Albumin 4.0 3.5 - 5.0 g/dL   AST 25 15 - 41 U/L   ALT 21 0 - 44 U/L   Alkaline Phosphatase 69 38 - 126 U/L   Total Bilirubin 0.9 0.0 - 1.2 mg/dL   GFR, Estimated >39 >39 mL/min   Anion gap 7 5 - 15  CBC   Collection Time: 04/03/24 10:24 AM  Result Value Ref Range   WBC 6.4 4.0 - 10.5 K/uL   RBC 5.29 4.22 - 5.81 MIL/uL   Hemoglobin 16.8 13.0 - 17.0 g/dL   HCT 51.3 60.9 - 47.9 %   MCV 91.9 80.0 - 100.0 fL   MCH 31.8 26.0 - 34.0 pg   MCHC 34.6 30.0 - 36.0 g/dL   RDW 87.0 88.4 - 84.4 %   Platelets 236 150 - 400 K/uL   nRBC 0.0 0.0 - 0.2 %  Urinalysis, Routine w reflex microscopic -Urine, Clean Catch   Collection Time: 04/03/24 12:50 PM  Result Value Ref Range   Color, Urine YELLOW (A) YELLOW   APPearance CLEAR (A) CLEAR   Specific Gravity, Urine 1.016 1.005 - 1.030   pH 5.0 5.0 - 8.0   Glucose, UA NEGATIVE NEGATIVE mg/dL   Hgb urine dipstick NEGATIVE NEGATIVE   Bilirubin Urine NEGATIVE NEGATIVE   Ketones, ur NEGATIVE NEGATIVE mg/dL   Protein, ur NEGATIVE NEGATIVE mg/dL   Nitrite NEGATIVE NEGATIVE   Leukocytes,Ua NEGATIVE NEGATIVE      Assessment & Plan:   Problem List Items Addressed This Visit   None    Discussed aspirin prophylaxis for myocardial infarction prevention and decision was {Blank single:19197::it was not indicated,made to continue ASA,made to start ASA,made to stop ASA,that we recommended ASA, and patient refused}  LABORATORY TESTING:  Health maintenance labs ordered today as discussed above.   The natural  history of prostate cancer and ongoing controversy regarding screening and potential treatment outcomes of prostate cancer has been discussed with the patient. The meaning of a false positive PSA and a false  negative PSA has been discussed. He indicates understanding of the limitations of this screening test and wishes *** to proceed with screening PSA testing.   IMMUNIZATIONS:   - Tdap: Tetanus vaccination status reviewed: {tetanus status:315746}. - Influenza: {Blank single:19197::Up to date,Administered today,Postponed to flu season,Refused,Given elsewhere} - Pneumovax: {Blank single:19197::Up to date,Administered today,Not applicable,Refused,Given elsewhere} - Prevnar: {Blank single:19197::Up to date,Administered today,Not applicable,Refused,Given elsewhere} - COVID: {Blank single:19197::Up to date,Administered today,Not applicable,Refused,Given elsewhere} - HPV: {Blank single:19197::Up to date,Administered today,Not applicable,Refused,Given elsewhere} - Shingrix vaccine: {Blank single:19197::Up to date,Administered today,Not applicable,Refused,Given elsewhere}  SCREENING: - Colonoscopy: {Blank single:19197::Up to date,Ordered today,Not applicable,Refused,Done elsewhere}  Discussed with patient purpose of the colonoscopy is to detect colon cancer at curable precancerous or early stages   - AAA Screening: {Blank single:19197::Up to date,Ordered today,Not applicable,Refused,Done elsewhere}  -Hearing Test: {Blank single:19197::Up to date,Ordered today,Not applicable,Refused,Done elsewhere}  -Spirometry: {Blank single:19197::Up to date,Ordered today,Not applicable,Refused,Done elsewhere}   PATIENT COUNSELING:    Sexuality: Discussed sexually transmitted diseases, partner selection, use of condoms, avoidance of unintended pregnancy  and contraceptive alternatives.   Advised to avoid cigarette  smoking.  I discussed with the patient that most people either abstain from alcohol or drink within safe limits (<=14/week and <=4 drinks/occasion for males, <=7/weeks and <= 3 drinks/occasion for females) and that the risk for alcohol disorders and other health effects rises proportionally with the number of drinks per week and how often a drinker exceeds daily limits.  Discussed cessation/primary prevention of drug use and availability of treatment for abuse.   Diet: Encouraged to adjust caloric intake to maintain  or achieve ideal body weight, to reduce intake of dietary saturated fat and total fat, to limit sodium intake by avoiding high sodium foods and not adding table salt, and to maintain adequate dietary potassium and calcium preferably from fresh fruits, vegetables, and low-fat dairy products.    stressed the importance of regular exercise  Injury prevention: Discussed safety belts, safety helmets, smoke detector, smoking near bedding or upholstery.   Dental health: Discussed importance of regular tooth brushing, flossing, and dental visits.   Follow up plan: NEXT PREVENTATIVE PHYSICAL DUE IN 1 YEAR. No follow-ups on file.    [1]  Allergies Allergen Reactions   Codeine    Oxycodone Itching   Percocet [Oxycodone-Acetaminophen ] Itching  [2]  Social History Tobacco Use  Smoking Status Every Day   Current packs/day: 0.25   Types: Cigarettes  Smokeless Tobacco Never   "
